# Patient Record
Sex: Female | Born: 1975
Health system: Southern US, Community
[De-identification: ages and names within clinical notes are randomized; demographics above are authoritative.]

## PROBLEM LIST (undated history)

## (undated) DIAGNOSIS — Z8489 Family history of other specified conditions: Secondary | ICD-10-CM

## (undated) DIAGNOSIS — E039 Hypothyroidism, unspecified: Secondary | ICD-10-CM

## (undated) DIAGNOSIS — M8580 Other specified disorders of bone density and structure, unspecified site: Secondary | ICD-10-CM

## (undated) DIAGNOSIS — K589 Irritable bowel syndrome without diarrhea: Secondary | ICD-10-CM

## (undated) HISTORY — DX: Other specified disorders of bone density and structure, unspecified site: M85.80

## (undated) HISTORY — DX: Hypothyroidism, unspecified: E03.9

---

## 2001-08-26 HISTORY — PX: TONSILLECTOMY: SUR1361

## 2005-01-23 ENCOUNTER — Inpatient Hospital Stay (HOSPITAL_COMMUNITY): Admission: AD | Admit: 2005-01-23 | Discharge: 2005-01-23 | Payer: Self-pay | Admitting: Obstetrics and Gynecology

## 2005-01-24 ENCOUNTER — Inpatient Hospital Stay (HOSPITAL_COMMUNITY): Admission: AD | Admit: 2005-01-24 | Discharge: 2005-01-24 | Payer: Self-pay | Admitting: Obstetrics and Gynecology

## 2005-03-02 ENCOUNTER — Inpatient Hospital Stay (HOSPITAL_COMMUNITY): Admission: AD | Admit: 2005-03-02 | Discharge: 2005-03-03 | Payer: Self-pay | Admitting: Obstetrics and Gynecology

## 2007-02-18 ENCOUNTER — Ambulatory Visit: Payer: Self-pay | Admitting: Oncology

## 2007-07-29 ENCOUNTER — Inpatient Hospital Stay (HOSPITAL_COMMUNITY): Admission: AD | Admit: 2007-07-29 | Discharge: 2007-07-29 | Payer: Self-pay | Admitting: Obstetrics and Gynecology

## 2007-07-30 ENCOUNTER — Inpatient Hospital Stay (HOSPITAL_COMMUNITY): Admission: AD | Admit: 2007-07-30 | Discharge: 2007-07-30 | Payer: Self-pay | Admitting: *Deleted

## 2007-08-17 ENCOUNTER — Observation Stay (HOSPITAL_COMMUNITY): Admission: RE | Admit: 2007-08-17 | Discharge: 2007-08-17 | Payer: Self-pay | Admitting: Obstetrics and Gynecology

## 2007-08-31 ENCOUNTER — Inpatient Hospital Stay (HOSPITAL_COMMUNITY): Admission: AD | Admit: 2007-08-31 | Discharge: 2007-09-03 | Payer: Self-pay | Admitting: Obstetrics and Gynecology

## 2010-09-16 ENCOUNTER — Encounter: Payer: Self-pay | Admitting: Family Medicine

## 2011-01-08 NOTE — Op Note (Signed)
Brittney Cannon, Brittney Cannon                 ACCOUNT NO.:  [id number]   MEDICAL RECORD NO.:  [id number]          PATIENT TYPE:  OBV   LOCATION:  9174                          [facility]  [facility]   PHYSICIAN:  Lenoard Aden, M.D.DATE OF BIRTH:  December 13, 1975   DATE OF PROCEDURE:  DATE OF DISCHARGE:  08/17/2007                               OPERATIVE REPORT   PREOPERATIVE DIAGNOSIS:  Breech malpresentation for external cephalic  version.   POSTOPERATIVE DIAGNOSIS:  Breech malpresentation for external cephalic  version.   PROCEDURE:  Attempted external cephalic version.   SURGEON:  Lenoard Aden, M.D.   ASSISTANT:  Marlinda Mike, C.N.M.   ANESTHESIA:  IV sedation.   DESCRIPTION OF PROCEDURE:  After being appraised of the risks and  benefits of the external cephalic version __________  a small risk of  fetal bradycardia with a need for C-section, success rates of  approximately 60%, the  patient is administered IV Stadol for relaxation  and terbutaline 0.25 mg subcu.  External cephalic version is then  attempted after reactive NST with a forward roll, backward roll and then  reattempt at forward roll under ultrasound guidance.  Fetal heart tones  dropped into the 90s briefly but then resumed in the 120 to 140 beat per  minute range.  Fetus was moved approximately into a oblique position but  could not be moved into vertex presentation during the external cephalic  version procedure.  No vaginal bleeding was noted.  Fetal heart tones  are reactive post procedure.  The patient tolerated the procedure well  and is recovering in good condition.      Lenoard Aden, M.D.  Electronically Signed     RJT/MEDQ  D:  08/17/2007  T:  08/17/2007  Job:  [id number]   cc:   Lenoard Aden, M.D.  Fax: [phone]

## 2011-01-08 NOTE — Discharge Summary (Signed)
Brittney Cannon, HERON                 ACCOUNT NO.:  [id number]   MEDICAL RECORD NO.:  [id number]          PATIENT TYPE:  OBV   LOCATION:  9174                          [facility]   PHYSICIAN:  Lenoard Aden, M.D.DATE OF BIRTH:  February 25, 1976   DATE OF ADMISSION:  08/17/2007  DATE OF DISCHARGE:  08/17/2007                               DISCHARGE SUMMARY   CHIEF COMPLAINT:  Persistent breach presentation at 36 weeks for  external cephalic version.   HISTORY OF PRESENT ILLNESS:  She is a 35 year old white female G3, P2,  history of spontaneous vaginal delivery x2 who presents at 36 weeks with  persistent breech presentation for external cephalic version.   ALLERGIES:  LATEX, PENICILLIN, HYDROCODONE.   SOCIAL HISTORY:  She is a nonsmoker, nondrinker.  She denies domestic or  physical violence.   PAST MEDICAL HISTORY:  1. History of irritable bowel syndrome.  2. Migraine headaches.  3. Depression.  4. Anemia.  5. No other medical problems.   FAMILY HISTORY:  Heart disease, mitral valve disease, hypertension,  ovarian cancer, CVA, spina bifida.   PAST OBSTETRICS HISTORY:  She has a pregnancy remarkable for a 6 pound,  12 ounce child born forceps, fourth degree laceration; history of 5  pound 8 ounce uncomplicated delivery.  She has new onset  antiphospholipid antibody syndrome for which she is untreated.  At this  time, she declined Lovenox therapy.  She has protein S deficiency and  anticardiolipin antibodies, and she was previously taking baby aspirin  daily.   PHYSICAL EXAMINATION:  GENERAL:  Well-developed, well-nourished, white  female in no acute distress.  HEENT:  Normal.  LUNGS:  Clear.  HEART:  Regular rhythm.  ABDOMEN:  Soft, gravid, nontender.  Estimated fetal weight  5-1/2  pounds, cervix is 2-3 cm, 60% vertex, -1.  EXTREMITIES:  No edema.  NEUROLOGICAL:  Nonfocal.  SKIN:  Intact.   IMPRESSION:  A 36-week intrauterine pregnancy for external cephalic  version with known breech malpresentation.   PLAN:  Proceed with external cephalic version.  Risks and benefits  discussed.  Small risk of fetal bradycardia and necessitating emergent C-  section in approximately 1-3% noted.  Success rates of 60% noted.  The  patient acknowledges and wishes to proceed.      Lenoard Aden, M.D.  Electronically Signed     RJT/MEDQ  D:  08/17/2007  T:  08/17/2007  Job:  [id number]

## 2011-01-11 NOTE — H&P (Signed)
Brittney Cannon, Brittney Cannon                 ACCOUNT NO.:  [id number]   MEDICAL RECORD NO.:  [id number]          PATIENT TYPE:  INP   LOCATION:  9108                          [facility]  [facility]   PHYSICIAN:  Lenoard Aden, M.D.DATE OF BIRTH:  1976/02/03   DATE OF ADMISSION:  03/02/2005  DATE OF DISCHARGE:                                HISTORY & PHYSICAL   CHIEF COMPLAINT:  Labor.   HISTORY OF PRESENT ILLNESS:  The patient is a 35 year old white female, G2,  P67, EDD of March 26, 2005, at 36+ weeks' gestation in active labor.   She has allergies to LATEX TAPE, PENICILLIN and HYDROCODONE.   Medications are prenatal vitamins.   She has a history of IVF-induced conception, history of depression, history  of a traumatic vaginal delivery with a fourth degree laceration and  questionable symphysis pubis separation, history of nonspecific heart  murmur.   Family history of hypertension, mitral valve prolapse, heart disease and  family history of spina bifida.   PRENATAL LABORATORY DATA:  Blood type O positive.  Rubella immune.  Hepatitis negative.  GBS negative.   PHYSICAL EXAMINATION:  GENERAL:  She is a well-developed, well-nourished  white female in no acute distress.  HEENT:  Normal.  CHEST:  Lungs clear.  CARDIAC:  Regular rate and rhythm.  ABDOMEN:  Soft, gravid, nontender.  Estimated fetal weight 6 pounds.  PELVIC:  Cervix is 10 cm, 100%, vertex +2.  EXTREMITIES:  Without cords.  NEUROLOGIC:  Nonfocal.   IMPRESSION:  1.  Thirty-six week intrauterine pregnancy in active labor.  2.  History of fourth degree laceration with traumatic delivery, patient      declined cesarean section.   PLAN:  Anticipate cautious attempts at vaginal delivery.  Risks and benefits  discussed.     RJT/MEDQ  D:  03/02/2005  T:  03/02/2005  Job:  [id number]

## 2011-01-11 NOTE — Op Note (Signed)
NAMETRENIA, TENNYSON                 ACCOUNT NO.:  [id number]   MEDICAL RECORD NO.:  [id number]          PATIENT TYPE:  INP   LOCATION:  9110                          [facility]   PHYSICIAN:  Lenoard Aden, M.D.DATE OF BIRTH:  [birth year]   DATE OF PROCEDURE:  08/30/2006  DATE OF DISCHARGE:                               OPERATIVE REPORT   PREOPERATIVE DIAGNOSIS:  A 37-week intrauterine pregnancy, breech in  active labor.   POSTOPERATIVE DIAGNOSIS:  A 37-week intrauterine pregnancy, breech in  active labor.   PROCEDURE:  Primary low segment transverse section.   SURGEON:  Lenoard Aden, M.D.   ANESTHESIA:  Spinal by Germaine Pomfret, M.D.   ESTIMATED BLOOD LOSS:  500 mL.   COMPLICATIONS:  None.   DRAINS:  Foley.   COUNTS:  Correct.   DISPOSITION:  To recovery in good condition.   FINDINGS:  A full-term living female.  Frank breech, Apgars 08/09.  Pediatricians in attendance.  Posterior placenta.  Normal uterus, normal  tubes, normal ovaries.  Two-layer closure.   OPERATIVE NOTE:  After being apprised of the risks of anesthesia,  infection, bleeding, injury to intra-abdominal organs, need for repair,  delayed risk of any complications include bowel and bladder injury.  The  patient was brought to the operating where she was administered spinal  anesthetic without complications, prepped, and draped in the usual  fashion.  Foley catheter placed.  After achieving adequate anesthesia  with dilute Marcaine solution, placed a Pfannenstiel skin incision made  with scalpel, carried down to fascia which was nicked in the midline and  transfixed using Mayo scissors.  Rectus muscles dissected sharply in the  midline.  Peritoneum entered sharply.  Bladder blade placed.  Visceral  peritoneum scored sharply off the lower uterine segment where a  hysterotomy incision made.  Atraumatic delivery of full-term living  female.  Handed to pediatrician in attendance.  Apgars 8 and 9.   Cord  blood collected.  Placenta delivered manually intact, 3-vessel cord  noted.  Uterus curetted with a dry lap pack,  and closed in two running imbricating layers of a #0 Monocryl suture.  Good hemostasis was noted.  Bladder flap inspected, found be hemostatic.  Irrigation accomplished.  At this time the fascia was closed using #0  Monocryl in a running fashion.  Skin closed using staples.  The patient  tolerated this procedure well, to recovery in good condition.      Lenoard Aden, M.D.  Electronically Signed     RJT/MEDQ  D:  08/31/2007  T:  08/31/2007  Job:  [id number]

## 2011-01-11 NOTE — Discharge Summary (Signed)
Brittney Cannon, Brittney Cannon                 ACCOUNT NO.:  [id number]   MEDICAL RECORD NO.:  [id number]          PATIENT TYPE:  INP   LOCATION:  9110                          [facility]   PHYSICIAN:  Lenoard Aden, M.D.DATE OF BIRTH:  1975-09-16   DATE OF ADMISSION:  08/31/2007  DATE OF DISCHARGE:  09/03/2007                               DISCHARGE SUMMARY   HISTORY OF PRESENT ILLNESS:  The patient underwent uncomplicated primary  Cesarean section on August 31, 2007. Postoperative course uncomplicated.   DISPOSITION:  Discharged to home on day 3. Discharge teaching done.   DISCHARGE MEDICATIONS:  1. Prenatal vitamin.  2. Iron.  3. Coumadin.  4. Dilaudid.   FOLLOWUP:  1. The patient to followup in the office in 4 to 6 weeks.  2. Followup with hematology as noted.      Lenoard Aden, M.D.  Electronically Signed     RJT/MEDQ  D:  09/22/2007  T:  09/22/2007  Job:  [id number]

## 2011-05-15 LAB — CBC
MCHC: 34.7
Platelets: 187
Platelets: 256
RDW: 12.7
RDW: 12.7
WBC: 9.1

## 2011-05-15 LAB — PROTIME-INR
INR: 0.9
INR: 1
Prothrombin Time: 12.9

## 2015-12-05 ENCOUNTER — Encounter: Payer: Self-pay | Admitting: Internal Medicine

## 2015-12-29 ENCOUNTER — Encounter: Payer: Self-pay | Admitting: Internal Medicine

## 2015-12-29 ENCOUNTER — Ambulatory Visit (INDEPENDENT_AMBULATORY_CARE_PROVIDER_SITE_OTHER): Payer: 59 | Admitting: Internal Medicine

## 2015-12-29 VITALS — BP 128/78 | HR 83 | Temp 98.3°F | Resp 18 | Ht 65.5 in | Wt 135.0 lb

## 2015-12-29 DIAGNOSIS — M8580 Other specified disorders of bone density and structure, unspecified site: Secondary | ICD-10-CM | POA: Diagnosis not present

## 2015-12-29 DIAGNOSIS — E039 Hypothyroidism, unspecified: Secondary | ICD-10-CM | POA: Diagnosis not present

## 2015-12-29 DIAGNOSIS — E063 Autoimmune thyroiditis: Secondary | ICD-10-CM | POA: Diagnosis not present

## 2015-12-29 DIAGNOSIS — Z0000 Encounter for general adult medical examination without abnormal findings: Secondary | ICD-10-CM

## 2015-12-29 DIAGNOSIS — G4700 Insomnia, unspecified: Secondary | ICD-10-CM

## 2015-12-29 LAB — POCT URINALYSIS DIPSTICK
BILIRUBIN UA: NEGATIVE
Blood, UA: NEGATIVE
Glucose, UA: NEGATIVE
KETONES UA: NEGATIVE
LEUKOCYTES UA: NEGATIVE
Nitrite, UA: NEGATIVE
Protein, UA: NEGATIVE
Spec Grav, UA: 1.015
Urobilinogen, UA: 0.2
pH, UA: 7

## 2015-12-29 NOTE — Progress Notes (Signed)
Subjective:    Patient ID: Brittney Cannon, female    DOB: 07-15-1976, 40 y.o.   MRN: [id number]  HPI 40 year old White Female presents to the office for the first time today. Onset of hypothyroidism 6-7 years ago. Cannot take Synthroid- says had severe allergic reaction. Takes Naturthroid. Sees Wellness physician. Has a notebook with numerous lab tests from wellness physician.  Intolerant of many medications. Cannot take penicillin causes breathing issues and a rash. Erythromycin causes vomiting and a rash. Codeine causes vomiting and a rash. Latex causes a rash. Doxycycline causes nausea and vomiting. Biaxin causes adverse reaction which patient cannot remember. Zithromax causes abdominal cramping.  Therefore, patient says she can take Levaquin and Keflex.  Dr. Billy Coast is GYN physician.  History of right second toe and metatarsal fracture June 2016. Says she had a similar event 4 years previously.  Tonsillectomy and sinus surgery 2003. C-section 2009.  3 pregnancies  Social history: Married. Does not smoke. Social alcohol consumption daily. Employed by [employer] on [place]. Has degree in MIS from [employer]. Works with [employer]. 3 sons ages 2110 and 8.  Family history: Father with history of hypertension. Mother with history of osteoporosis, hip fracture, arthritis and kidney stones. 2 brothers one of whom is overweight. One sister in good health.  History of anti-Cardiolipin antibody  Last mammogram December 2016 at [facility]  Had bone density study at [facility] August 2014 with right femoral neck T score being -1.9, left femoral neck -2.1, LS spine -0.3 - diagnosed with osteopenia. Had bone density study December 2016. Total left femur T score -2.3 and. LS spine -0.7  At one point she took Forteo injections but that was some years ago. Currently taking calcium and vitamin D as well as multivitamin.    Review of Systems  HENT:       Difficulty breathing through nose    Endocrine:       Hair loss  Genitourinary:       Menarche at age 45. History of dysmenorrhea.  Musculoskeletal:       Joint pain  Neurological:       Complain of difficulty maintaining balance and some dizziness but not true vertigo, vague weakness in hands and feet  Psychiatric/Behavioral:       Complained of insomnia, difficulty smelling things       Objective:   Physical Exam  Constitutional: She is oriented to person, place, and time. She appears well-developed and well-nourished. No distress.  HENT:  Head: Normocephalic and atraumatic.  Right Ear: External ear normal.  Left Ear: External ear normal.  Mouth/Throat: Oropharynx is clear and moist. No oropharyngeal exudate.  Eyes: Conjunctivae and EOM are normal. Pupils are equal, round, and reactive to light.  Neck: Neck supple. No JVD present. No thyromegaly present.  Cardiovascular: Normal heart sounds.   No murmur heard. Pulmonary/Chest: Effort normal and breath sounds normal. She has no wheezes. She has no rales.  Breasts normal female without masses  Abdominal: Soft. Bowel sounds are normal. She exhibits no distension and no mass. There is no tenderness. There is no rebound and no guarding.  Genitourinary:  Deferred to GYN  Musculoskeletal: She exhibits no edema.  Lymphadenopathy:    She has no cervical adenopathy.  Neurological: She is alert and oriented to person, place, and time. She has normal reflexes. No cranial nerve deficit. Coordination normal.  Skin: Skin is warm and dry. No rash noted. She is not diaphoretic.  Psychiatric: She has a  normal mood and affect. Her behavior is normal. Judgment and thought content normal.  Vitals reviewed.         Assessment & Plan:  History of hypothyroidism  History of osteopenia-last bone density study 2016  History of anticardiolipin antibody  Plan: Patient says she was to be proactive with regard to osteopenia. Worried about her condition since mother has  osteoporosis and history of hip fracture. Have suggested endocrinology consultation. She will consider it. Patient was in a hurry today. Return in one year or as needed. Wellness physician keep a check on her thyroid functions. Recent labs reviewed and appear to be within normal limits.  [facility] case she takes Vyvanse, Flexeril, Xanax 1 mg at bedtime for insomnia, when necessary Phenergan, Cytomel in addition to other medications mentioned. Previously [facility].

## 2016-01-01 ENCOUNTER — Encounter: Payer: Self-pay | Admitting: Internal Medicine

## 2016-01-21 ENCOUNTER — Encounter: Payer: Self-pay | Admitting: Internal Medicine

## 2016-01-21 DIAGNOSIS — G4700 Insomnia, unspecified: Secondary | ICD-10-CM | POA: Insufficient documentation

## 2016-01-21 NOTE — Patient Instructions (Signed)
Consider endocrinology consultation regarding management of osteopenia.

## 2016-04-30 ENCOUNTER — Encounter: Payer: Self-pay | Admitting: Internal Medicine

## 2016-04-30 ENCOUNTER — Ambulatory Visit (INDEPENDENT_AMBULATORY_CARE_PROVIDER_SITE_OTHER): Payer: 59 | Admitting: Internal Medicine

## 2016-04-30 VITALS — BP 122/74 | HR 90 | Temp 98.6°F | Ht 65.5 in | Wt 139.5 lb

## 2016-04-30 DIAGNOSIS — J0100 Acute maxillary sinusitis, unspecified: Secondary | ICD-10-CM

## 2016-04-30 DIAGNOSIS — H6503 Acute serous otitis media, bilateral: Secondary | ICD-10-CM | POA: Diagnosis not present

## 2016-04-30 MED ORDER — LEVOFLOXACIN 250 MG PO TABS: 250.0000 mg | ORAL_TABLET | Freq: Every day | ORAL | 0 refills | Status: DC

## 2016-05-02 ENCOUNTER — Encounter: Payer: Self-pay | Admitting: Endocrinology

## 2016-05-02 ENCOUNTER — Ambulatory Visit (INDEPENDENT_AMBULATORY_CARE_PROVIDER_SITE_OTHER): Payer: Managed Care, Other (non HMO) | Admitting: Endocrinology

## 2016-05-02 VITALS — BP 122/60 | HR 112 | Ht 65.5 in | Wt 139.0 lb

## 2016-05-02 DIAGNOSIS — M8580 Other specified disorders of bone density and structure, unspecified site: Secondary | ICD-10-CM | POA: Diagnosis not present

## 2016-05-02 NOTE — Progress Notes (Addendum)
Subjective:    Patient ID: Brittney Cannon, female    DOB: 04/25/1976, 40 y.o.   MRN: [id number]  HPI Pt is referred by Dr Lenord Fellers, for osteoporosis.  She was noted to have osteoporosis in 2009.  She took forteo x 6 months, in 2013.  She stopped due to nausea.  She then took growth hormone x 6 months, also in 2013.  She has had 3 bony fractures, all of the feet (2010-2016, each with minor injuries).  She has no history of any of the following: early menopause, cancer, renal dz, prolonged bedrest, smoking, liver dz, primary hyperparathyroidism.  She does not take heparin or anticonvulsants.  She took intermittent steroids for allergic rhinitis, but none in the past few years.  She drinks 1 drink qod.  She intermittently takes compounded vitamin-D and testsoterone  She took lovenox intermittently from 2006-2009).  She has mild pain throughout the body, and assoc headache.  Past Medical History:  Diagnosis Date  . Hypothyroidism   . Osteopenia     Past Surgical History:  Procedure Laterality Date  . CESAREAN SECTION  2009  . TONSILLECTOMY  2003    Social History   Social History  . Marital status: Married    Spouse name: N/A  . Number of children: N/A  . Years of education: N/A   Occupational History  . Not on file.   Social History Main Topics  . Smoking status: Never Smoker  . Smokeless tobacco: Never Used  . Alcohol use Not on file  . Drug use: Unknown  . Sexual activity: Not on file   Other Topics Concern  . Not on file   Social History Narrative  . No narrative on file    Current Outpatient Prescriptions on File Prior to Visit  Medication Sig Dispense Refill  . ALPRAZolam (XANAX) 1 MG tablet Take 1 mg by mouth.    . cyclobenzaprine (FLEXERIL) 5 MG tablet     . fluticasone (FLONASE) 50 MCG/ACT nasal spray     . levofloxacin (LEVAQUIN) 250 MG tablet Take 1 tablet (250 mg total) by mouth daily. 10 tablet 0  . lisdexamfetamine (VYVANSE) 40 MG capsule Take 40 mg by mouth  every morning.    Marland Kitchen NATURE-THROID 162.5 MG TABS TK 1 T PO QD  5  . Promethazine HCl (PHENERGAN PO) Take by mouth.     No current facility-administered medications on file prior to visit.     Allergies  Allergen Reactions  . Doxycycline Nausea And Vomiting    Patient reported  . Azithromycin   . Codeine Nausea And Vomiting  . Erythromycin Nausea And Vomiting  . Lactose Intolerance (Gi)   . Latex Dermatitis  . Penicillins Hives  . Tramadol Itching    Family History  Problem Relation Age of Onset  . Osteoporosis Mother   . Osteoporosis Sister   . Osteoporosis Maternal Grandmother    BP 122/60   Pulse (!) 112   Ht 5' 5.5" (1.664 m)   Wt 139 lb (63 kg)   SpO2 98%   BMI 22.78 kg/m   Review of Systems denies weight loss, hematuria, heartburn, cold intolerance, edema, skin rash, falls, cramps, memory loss, easy bruising, and rhinorrhea. She has insomnia and low-back pain.      Objective:   Physical Exam VS: see vs page GEN: no distress HEAD: head: no deformity eyes: no periorbital swelling, no proptosis external nose and ears are normal mouth: no lesion seen NECK: supple, thyroid is  not enlarged CHEST WALL: no deformity.  No kyphosis LUNGS: clear to auscultation CV: reg rate and rhythm, no murmur.   ABD: abdomen is soft, nontender.  no hepatosplenomegaly.  not distended.  no hernia MUSCULOSKELETAL: muscle bulk and strength are grossly normal.  no obvious joint swelling.  gait is normal and steady EXTEMITIES: no deformity.  no edema PULSES: no carotid bruit NEURO:  cn 2-12 grossly intact.   readily moves all 4's.  sensation is intact to touch on all 4's.  SKIN:  Normal texture and temperature.  No rash or suspicious lesion is visible.   NODES:  None palpable at the neck.  PSYCH: alert, well-oriented.  Does not appear anxious nor depressed.   outside test results are reviewed: Testosterone=209. Free T4=0.85 Ca++=9.5 Tot prot=normal    Assessment & Plan:    Osteoporosis: new to me: given her young age, I advised fosamax, reclast, or prolia.  She declines. However, I asked her to reconsider.  Myalgias and/or arthralgias, not related to the above

## 2016-05-02 NOTE — Patient Instructions (Addendum)
Please fax us the bone-density test results.  Please also have a TSH and PTH done, and send us the results.   Please consider taking actonel (once a week pill), or reclast (once a year infusion), and let us know.

## 2016-05-04 ENCOUNTER — Encounter: Payer: Self-pay | Admitting: Endocrinology

## 2016-05-22 NOTE — Progress Notes (Signed)
   Subjective:    Patient ID: Brittney Cannon, female    DOB: [birth year], 40 y.o.   MRN: [id number]  HPI Three-day history of right-sided headache and respiratory congestion. Has malaise and fatigue. No documented fever. Feels that she might have a sinus infection.    Review of Systems as above     Objective:   Physical Exam Skin warm and dry. Nodes none. TMs full bilaterally but not dull. Neck is supple. No adenopathy. Chest clear to auscultation without rales or wheezing.       Assessment & Plan:  Acute maxillary sinusitis  Bilateral serous otitis media  Plan: Levaquin 250 mg daily for 10 days. Flonase nasal spray 2 sprays in each nostril daily. Call if not better in 7-10 days or sooner if worse.

## 2016-05-22 NOTE — Patient Instructions (Signed)
Levaquin 250 mg daily for 10 days. Flonase nasal spray as directed. Call if not better in 7-10 days or sooner if worse.

## 2016-10-11 ENCOUNTER — Other Ambulatory Visit: Payer: Self-pay | Admitting: Internal Medicine

## 2016-10-11 MED ORDER — ALPRAZOLAM 0.5 MG PO TABS: 0.5000 mg | ORAL_TABLET | Freq: Two times a day (BID) | ORAL | 0 refills | Status: DC | PRN

## 2016-11-19 ENCOUNTER — Telehealth: Payer: Self-pay | Admitting: Internal Medicine

## 2016-11-19 MED ORDER — SCOPOLAMINE 1 MG/3DAYS TD PT72: 1.0000 | MEDICATED_PATCH | TRANSDERMAL | 0 refills | Status: DC

## 2016-11-19 NOTE — Telephone Encounter (Signed)
Please call in transderm scop transdermal patches. Apply one q 3 days.

## 2016-11-19 NOTE — Telephone Encounter (Signed)
E-SCRIBED to [facility] [phone] - [place], [place] - [phone] AT [facility] [address]. Patient was notified.
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
## 2016-11-22 MED FILL — TRANSDERM-SCOP 1.5 MG/3 DAY: 1 | 30 days supply | Qty: 10 | Fill #0

## 2017-01-10 ENCOUNTER — Ambulatory Visit: Payer: 59 | Admitting: Internal Medicine

## 2017-04-14 ENCOUNTER — Ambulatory Visit (INDEPENDENT_AMBULATORY_CARE_PROVIDER_SITE_OTHER): Payer: 59 | Admitting: Internal Medicine

## 2017-04-14 ENCOUNTER — Encounter: Payer: Self-pay | Admitting: Internal Medicine

## 2017-04-14 VITALS — BP 112/70 | HR 95 | Temp 98.3°F | Ht 65.5 in | Wt 132.0 lb

## 2017-04-14 DIAGNOSIS — M8580 Other specified disorders of bone density and structure, unspecified site: Secondary | ICD-10-CM

## 2017-04-14 DIAGNOSIS — F4320 Adjustment disorder, unspecified: Secondary | ICD-10-CM

## 2017-04-14 DIAGNOSIS — F4321 Adjustment disorder with depressed mood: Secondary | ICD-10-CM

## 2017-04-14 DIAGNOSIS — F5102 Adjustment insomnia: Secondary | ICD-10-CM

## 2017-04-14 MED ORDER — ALPRAZOLAM 0.5 MG PO TABS: ORAL_TABLET | ORAL | 0 refills | Status: DC

## 2017-04-14 NOTE — Patient Instructions (Addendum)
Try Xanax 0.5 mg every morning and 1 mg at bedtime. Follow-up in 4 weeks. Consider individual counseling. Bone density study ordered.

## 2017-04-14 NOTE — Progress Notes (Signed)
   Subjective:    Patient ID: Brittney Cannon, female    DOB: 1976-03-24, 41 y.o.   MRN: [id number]  HPI 41 year old Female in today to discuss issues surrounding grief and loss of her husband to liver cancer recently. She is not sleeping well. His been taking Xanax but only gets a couple hours of sleep.  Has been very fatigued and is aching all over.  Trouble thinking clearly.  She has 3 sons ages 8, 31 and 35  Osteoporosis runs in her mother. Patient had bone density study at [facility] in 2016 showing osteopenia. She is very concerned about this. She saw Dr. Romero Cannon for an opinion regarding therapy in September 2017. He discussed Fosamax re-classed or Prolene for her since she is young but she really didn't want to consider those options. She has mentioned human growth hormone that they'll agree with that therapy. At that time she was also complaining of myalgias and arthralgias but he did not feel that these were related to osteopenia nor do I.  I think it's possible she may have fibromyalgia syndrome.  Have recommended Brittney Cannon for counseling.      Review of Systems see above . Spent 25 minutes speaking with her about these issues. I have ordered another bone density study to be done in the near future since the last one was 2016.     Objective:   Physical Exam  Not examined see above      Assessment & Plan:  Grief reaction  Fatigue  Myalgias  Osteopenia-She may want to see another endocrinologist  Insomnia due to grief  Plan: Patient does not want to be on antidepressant therapy. She is to consider counseling with individual counselor. Bone density study ordered at [facility] to compare with 2016 study. Try Xanax 0.5 mg every morning and 1 mg at bedtime. Follow-up in 4 weeks.

## 2017-05-13 ENCOUNTER — Encounter: Payer: Self-pay | Admitting: Internal Medicine

## 2017-05-13 ENCOUNTER — Ambulatory Visit (INDEPENDENT_AMBULATORY_CARE_PROVIDER_SITE_OTHER): Payer: 59 | Admitting: Internal Medicine

## 2017-05-13 VITALS — BP 120/60 | HR 92 | Temp 99.6°F | Wt 138.0 lb

## 2017-05-13 DIAGNOSIS — F4321 Adjustment disorder with depressed mood: Secondary | ICD-10-CM

## 2017-05-13 DIAGNOSIS — F4320 Adjustment disorder, unspecified: Secondary | ICD-10-CM | POA: Diagnosis not present

## 2017-05-13 DIAGNOSIS — F5102 Adjustment insomnia: Secondary | ICD-10-CM

## 2017-05-13 DIAGNOSIS — M8580 Other specified disorders of bone density and structure, unspecified site: Secondary | ICD-10-CM | POA: Diagnosis not present
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
## 2017-05-13 NOTE — Patient Instructions (Addendum)
Lab work to be done at [facility] for fatigue. Continue Xanax as needed for anxiety and insomnia. Try to get endocrinology consultation for osteopenia.

## 2017-05-14 ENCOUNTER — Encounter: Payer: Self-pay | Admitting: Internal Medicine

## 2017-05-14 ENCOUNTER — Telehealth: Payer: Self-pay

## 2017-05-14 NOTE — Telephone Encounter (Signed)
Dr. Leslie Dales is declining to see her as a patient. Dr. Lenord Fellers is aware. Waiting on her request before moving forward

## 2017-05-21 ENCOUNTER — Encounter: Payer: Self-pay | Admitting: Internal Medicine

## 2017-08-18 ENCOUNTER — Encounter: Payer: Self-pay | Admitting: Internal Medicine

## 2017-08-18 ENCOUNTER — Telehealth: Payer: Self-pay | Admitting: Internal Medicine

## 2017-08-18 MED ORDER — LEVOFLOXACIN 500 MG PO TABS: 500.0000 mg | ORAL_TABLET | Freq: Every day | ORAL | 0 refills | Status: DC

## 2017-08-18 MED ORDER — HYDROCODONE-HOMATROPINE 5-1.5 MG/5ML PO SYRP: 5.0000 mL | ORAL_SOLUTION | Freq: Three times a day (TID) | ORAL | 0 refills | Status: DC | PRN

## 2017-08-18 NOTE — Telephone Encounter (Signed)
Pt called. [place] tomorrow. Has had URI for one week. Not sleeping. Does not want OV today. No fever or chills. Says Levaquin works for her. Will pick up Rx for Hycodan Call in Levaquin to pharmacy.

## 2017-09-01 ENCOUNTER — Encounter: Payer: Self-pay | Admitting: Internal Medicine

## 2017-09-01 ENCOUNTER — Ambulatory Visit
Admission: RE | Admit: 2017-09-01 | Discharge: 2017-09-01 | Disposition: A | Discharge status: Home / Self Care | Payer: 59 | Source: Ambulatory Visit | Attending: Internal Medicine | Admitting: Internal Medicine

## 2017-09-01 ENCOUNTER — Ambulatory Visit (INDEPENDENT_AMBULATORY_CARE_PROVIDER_SITE_OTHER): Payer: 59 | Admitting: Internal Medicine

## 2017-09-01 VITALS — BP 112/70 | HR 88 | Temp 99.1°F | Ht 65.5 in | Wt 142.0 lb

## 2017-09-01 DIAGNOSIS — R0602 Shortness of breath: Secondary | ICD-10-CM

## 2017-09-01 DIAGNOSIS — R0600 Dyspnea, unspecified: Secondary | ICD-10-CM | POA: Diagnosis not present

## 2017-09-01 DIAGNOSIS — R5383 Other fatigue: Secondary | ICD-10-CM | POA: Diagnosis not present

## 2017-09-01 DIAGNOSIS — R911 Solitary pulmonary nodule: Secondary | ICD-10-CM

## 2017-09-01 NOTE — Patient Instructions (Signed)
To have CT scan of chest.  Labs drawn and pending including thyroid functions, CBC and sed rate.  Further instructions to follow-up once CT scan results are obtained .

## 2017-09-01 NOTE — Progress Notes (Signed)
   Subjective:    Patient ID: Brittney Cannon, female    DOB: 09/16/1975, 42 y.o.   MRN: [id number]  HPI her husband died late last year complications of liver cancer.  She took [place] 2 [place] for Christmas break.  Was gone about 8 days.  Left on December 25 and just got back over the weekend.  Had respiratory infection prior to leaving and was prescribed.  Levaquin was prescribed for her by phone on December 24 however it made her nauseated.  Noticed she was short of breath while in [place].  It first she attributed to the altitude and not being used to it but it persisted.  Says she used to live in [place] and tolerated it fine at that point.  Has not really been exercising recently.  Says she does not feel like it.  Says she cannot get her house in order.  Says she used to take Vyvanse for attention deficit but has not been taking it since her husband passed away.  Says 2 sons are doing well adjusting.  Denies chest pain.  We walk down the hall with pulse oximetry and it did not change.  Remain the same at 97%.  She went to have chest x-ray and Dr. Chestine Sporelark noticed there is possible right upper lung nodule and recommended CT scan which will be ordered.  She is going to [facility] to have some lab work drawn including CBC sed rate and TSH.  She goes to [facility] in [place].  Has been told that Epstein-Barr virus titer has increased recently.  She attributes this to stress.    Review of Systems See above-EKG is within normal limits    Objective:   Physical Exam Skin warm and dry.  Neck is supple without JVD thyromegaly or carotid bruits.  Chest clear to auscultation without rales or wheezing.  Cardiac exam regular rate and rhythm.  EKG is within normal limits.  Extremities without edema.       Assessment & Plan:  Unexplained dyspnea-to have CT scan in the near future  EKG is within normal limits.  She is to have fasting lab work at [facility]  tomorrow.  CT scan of the chest pending.  She may also need to have 2D echocardiogram.

## 2017-09-02 ENCOUNTER — Ambulatory Visit
Admission: RE | Admit: 2017-09-02 | Discharge: 2017-09-02 | Disposition: A | Discharge status: Home / Self Care | Payer: 59 | Source: Ambulatory Visit | Attending: Internal Medicine | Admitting: Internal Medicine

## 2017-09-02 DIAGNOSIS — R0602 Shortness of breath: Secondary | ICD-10-CM

## 2017-09-02 DIAGNOSIS — R0600 Dyspnea, unspecified: Secondary | ICD-10-CM

## 2017-09-02 MED ORDER — IOPAMIDOL (ISOVUE-300) INJECTION 61%
75.0000 mL | Freq: Once | INTRAVENOUS | Status: AC | PRN
  Administered 2017-09-02: 75 mL via INTRAVENOUS

## 2017-09-04 ENCOUNTER — Other Ambulatory Visit: Payer: 59

## 2017-09-05 ENCOUNTER — Telehealth: Payer: Self-pay

## 2017-10-10 NOTE — Telephone Encounter (Signed)
Error

## 2017-11-18 ENCOUNTER — Telehealth: Payer: Self-pay

## 2017-11-18 NOTE — Telephone Encounter (Signed)
I don't know what she means about stopping periods. Please call her. May need to see GYN.

## 2017-11-18 NOTE — Telephone Encounter (Signed)
Spoke with patient.  She is going on a big vacation and she is due to start her menstrual cycle in a few days.  She would like to be prescribed Norethisterone so that her period can be delayed until she returns from vacation.  She has done this years ago.  It was either .30 or .35 mg.  It has been so many years ago, she's not sure of the mg dosage, but it's supposed to be started 2 or 3 days prior to when your period starts and then when you stop taking it, you start your cycle and have it like you would normally have it.  She states she just didn't want to have to deal with her cycle on her vacation.    Pharmacy:  [facility] [address]  Phone:  [phone]

## 2017-11-18 NOTE — Telephone Encounter (Signed)
Patient called states she was prescribed norethindrone in the past to stop her periods. She would like a refill but she does not know the dose she said it's norethindrone.  [facility] to [facility] and they said they don't have anything on file.  I called patient to see where she got this prescription last and her voice mail was full.  She [facility] [address]

## 2017-11-18 NOTE — Telephone Encounter (Signed)
Called patient at 4 PM.  She is leaving tomorrow to go on vacation to the beach and to [place].  Explained to her that I was not comfortable just calling in this prescription.  We did not have a Pap smear on file for her.  She says she understands.

## 2017-12-04 ENCOUNTER — Ambulatory Visit (INDEPENDENT_AMBULATORY_CARE_PROVIDER_SITE_OTHER): Payer: 59 | Admitting: Internal Medicine

## 2017-12-04 ENCOUNTER — Ambulatory Visit
Admission: RE | Admit: 2017-12-04 | Discharge: 2017-12-04 | Disposition: A | Discharge status: Home / Self Care | Payer: 59 | Source: Ambulatory Visit | Attending: Internal Medicine | Admitting: Internal Medicine

## 2017-12-04 ENCOUNTER — Encounter: Payer: Self-pay | Admitting: Internal Medicine

## 2017-12-04 VITALS — BP 130/80 | HR 70 | Ht 65.5 in | Wt 142.0 lb

## 2017-12-04 DIAGNOSIS — S99911A Unspecified injury of right ankle, initial encounter: Secondary | ICD-10-CM

## 2017-12-04 DIAGNOSIS — W19XXXA Unspecified fall, initial encounter: Secondary | ICD-10-CM | POA: Diagnosis not present

## 2017-12-04 DIAGNOSIS — M25571 Pain in right ankle and joints of right foot: Secondary | ICD-10-CM

## 2017-12-04 NOTE — Progress Notes (Signed)
   Subjective:    Patient ID: Brittney Cannon, female    DOB: [birth year], 42 y.o.   MRN: [id number]  HPI About 8 weeks ago patient was really.  She tripped over her son's snow boots and landed flat on her deck.  She hurt her right ankle pop and felt immediate pain.  It did not swell all that much.  However it has continued to be painful.  She is tried to run walk and soon will be playing tennis.  It still bothering her.  It never bruised.    Review of Systems see above     Objective:   Physical Exam  She has tenderness right lateral malleolus extending down to the metatarsal.  Good range of motion with the foot.  No swelling or bruising.      Assessment & Plan:  She has not had x-ray.  She will go for an x-ray to see if she has a small fibula avulsion with probable right ankle sprain.  Plan: We will advise further after x-ray.

## 2017-12-04 NOTE — Patient Instructions (Signed)
Have Xray of right ankle

## 2018-01-20 ENCOUNTER — Telehealth: Payer: Self-pay | Admitting: Internal Medicine

## 2018-01-20 DIAGNOSIS — M25571 Pain in right ankle and joints of right foot: Secondary | ICD-10-CM

## 2018-01-20 DIAGNOSIS — W19XXXA Unspecified fall, initial encounter: Secondary | ICD-10-CM

## 2018-01-20 NOTE — Telephone Encounter (Signed)
Faxed medical records to [facility] 1st 6 pages, 2nd 65 pages,collected $75

## 2018-01-27 ENCOUNTER — Telehealth: Payer: Self-pay | Admitting: Internal Medicine

## 2018-01-27 NOTE — Telephone Encounter (Signed)
Patient is out of [place] in [place].  She has been sick with a bad URI.  She had Levaquin on hand and took a 7 day treatment.  She finished this about 1 1/2 weeks ago.  She has continued to have a bad cough.  Wanted to know if it's ok to take Delsym around the clock every 8 hours??  No fever since she finished the antibiotics.  The cough is in her chest, non productive.  She will be home Saturday for 1 day only, then returning to [place].    Phone:  [phone]  [facility] [place] on [address]

## 2018-01-29 NOTE — Telephone Encounter (Signed)
Spoke with Dr. Lenord FellersBaxley and she states that she is NOT going to call her anything in.  In fact, patient should NOT be taking Delsym every 8 hours.  She should be taking it every 12 hours.  And, she may need a repeat antibiotic.  However, she is not willing to call this in for her to [place] because she feels that someone needs to listen to her lungs.  We have to be sure the patient doesn't have pneumonia.  She needs to go to an Urgent Care there in [place] and be seen.    Spoke with patient this morning.  Patient is on her way home.  States that she can no longer take the Delsym because it is hurting her stomach at this point.  And, she knows that she needs to be seen.  She has made an appointment for Friday morning, 6/7 at 11:45 to be seen for the cough.  She feels she may need a steroid and something stronger for the cough.  We will see her tomorrow for evaluation.

## 2018-01-30 ENCOUNTER — Ambulatory Visit (INDEPENDENT_AMBULATORY_CARE_PROVIDER_SITE_OTHER): Payer: 59 | Admitting: Internal Medicine

## 2018-01-30 ENCOUNTER — Encounter: Payer: Self-pay | Admitting: Internal Medicine

## 2018-01-30 VITALS — BP 120/70 | HR 62 | Temp 98.9°F | Ht 65.5 in | Wt 140.0 lb

## 2018-01-30 DIAGNOSIS — J22 Unspecified acute lower respiratory infection: Secondary | ICD-10-CM | POA: Diagnosis not present

## 2018-01-30 MED ORDER — TERCONAZOLE 0.4 % VA CREA: 1.0000 | TOPICAL_CREAM | Freq: Every day | VAGINAL | 0 refills | Status: DC

## 2018-01-30 MED ORDER — BENZONATATE 100 MG PO CAPS: 100.0000 mg | ORAL_CAPSULE | Freq: Three times a day (TID) | ORAL | 1 refills | Status: DC | PRN

## 2018-01-30 MED ORDER — LEVOFLOXACIN 500 MG PO TABS: 500.0000 mg | ORAL_TABLET | Freq: Every day | ORAL | 1 refills | Status: DC

## 2018-01-30 MED ORDER — METHYLPREDNISOLONE ACETATE 80 MG/ML IJ SUSP
80.0000 mg | Freq: Once | INTRAMUSCULAR | Status: AC
  Administered 2018-01-30: 80 mg via INTRAMUSCULAR

## 2018-02-20 NOTE — Progress Notes (Signed)
   Subjective:    Patient ID: Brittney Cannon, female    DOB: 03/02/1976, 42 y.o.   MRN: [id number]  HPI Patient in today with history of URI symptoms around May 17 or 18.  Subsequently around June 7 developed face and ear tenderness.  Was dizzy.  Had discolored nasal drainage and chest congestion.  Had a temperature of 102 degrees.  Has been traveling.    Review of Systems see above     Objective:   Physical Exam Has congested cough.  TMs slightly full bilaterally but not red.  Pharynx slightly injected.  Neck is supple without  adenopathy.  Chest clear to auscultation without rales or wheezing.       Assessment & Plan:  Acute lower respiratory infection  Plan: Tessalon Perles 100 mg 3 times daily as needed for cough.  Levaquin 500 mg daily for 10 days with 1 refill.  She will be traveling some the summer with her family.  Rest and drink plenty of fluids.

## 2018-02-20 NOTE — Patient Instructions (Signed)
Levaquin 500 mg daily for 10 days.  Tessalon Perles 100 mg 3 times daily as needed for cough.  Rest and drink plenty of fluids.

## 2018-04-10 ENCOUNTER — Ambulatory Visit: Payer: 59 | Admitting: Internal Medicine

## 2018-04-14 ENCOUNTER — Encounter: Payer: Self-pay | Admitting: Internal Medicine

## 2018-04-14 ENCOUNTER — Ambulatory Visit (INDEPENDENT_AMBULATORY_CARE_PROVIDER_SITE_OTHER): Payer: 59 | Admitting: Internal Medicine

## 2018-04-14 DIAGNOSIS — M545 Low back pain, unspecified: Secondary | ICD-10-CM

## 2018-04-14 DIAGNOSIS — M542 Cervicalgia: Secondary | ICD-10-CM

## 2018-04-14 MED ORDER — ALPRAZOLAM 0.5 MG PO TABS: ORAL_TABLET | ORAL | 1 refills | Status: DC

## 2018-04-14 MED ORDER — ALPRAZOLAM 0.5 MG PO TABS: ORAL_TABLET | ORAL | 0 refills | Status: DC
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
## 2018-04-14 NOTE — Patient Instructions (Addendum)
Take Advil 600 mg twice daily.  Have x-rays of neck and LS spine.  Order written for physical therapy.  Apply ice or heat to neck and back.

## 2018-04-29 ENCOUNTER — Telehealth: Payer: Self-pay | Admitting: Internal Medicine

## 2018-04-29 MED ORDER — CYCLOBENZAPRINE HCL 10 MG PO TABS: 10.0000 mg | ORAL_TABLET | Freq: Three times a day (TID) | ORAL | 0 refills | Status: DC
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
## 2018-07-16 ENCOUNTER — Other Ambulatory Visit: Payer: Self-pay | Admitting: Internal Medicine

## 2018-08-22 IMAGING — CR DG CHEST 2V
2 series · 2 of 2 positions shown · non-contrast
Comparison: None.

CLINICAL DATA: Dyspnea 1 week

EXAM:
CHEST  2 VIEW

[w chest pa]
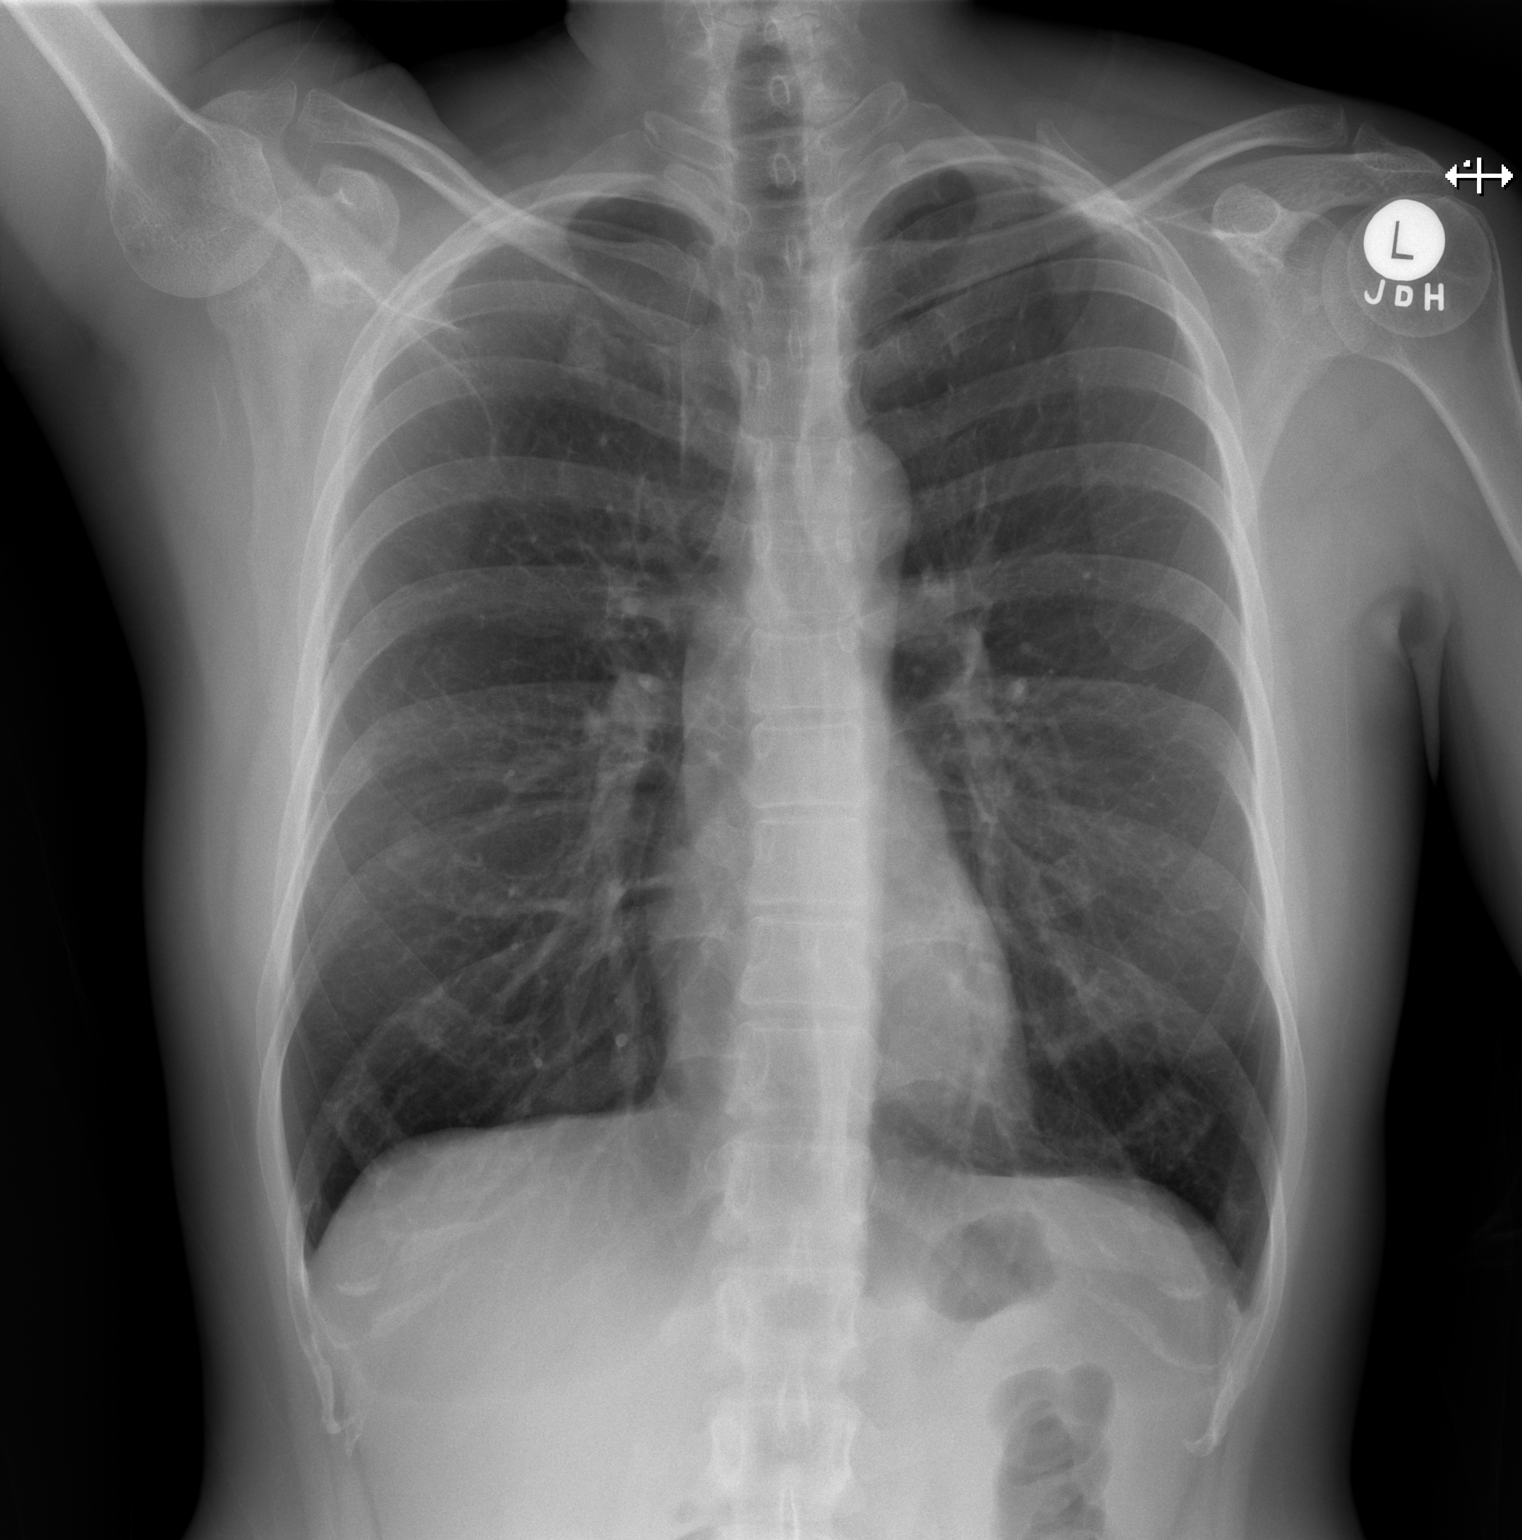

[w chest lat]
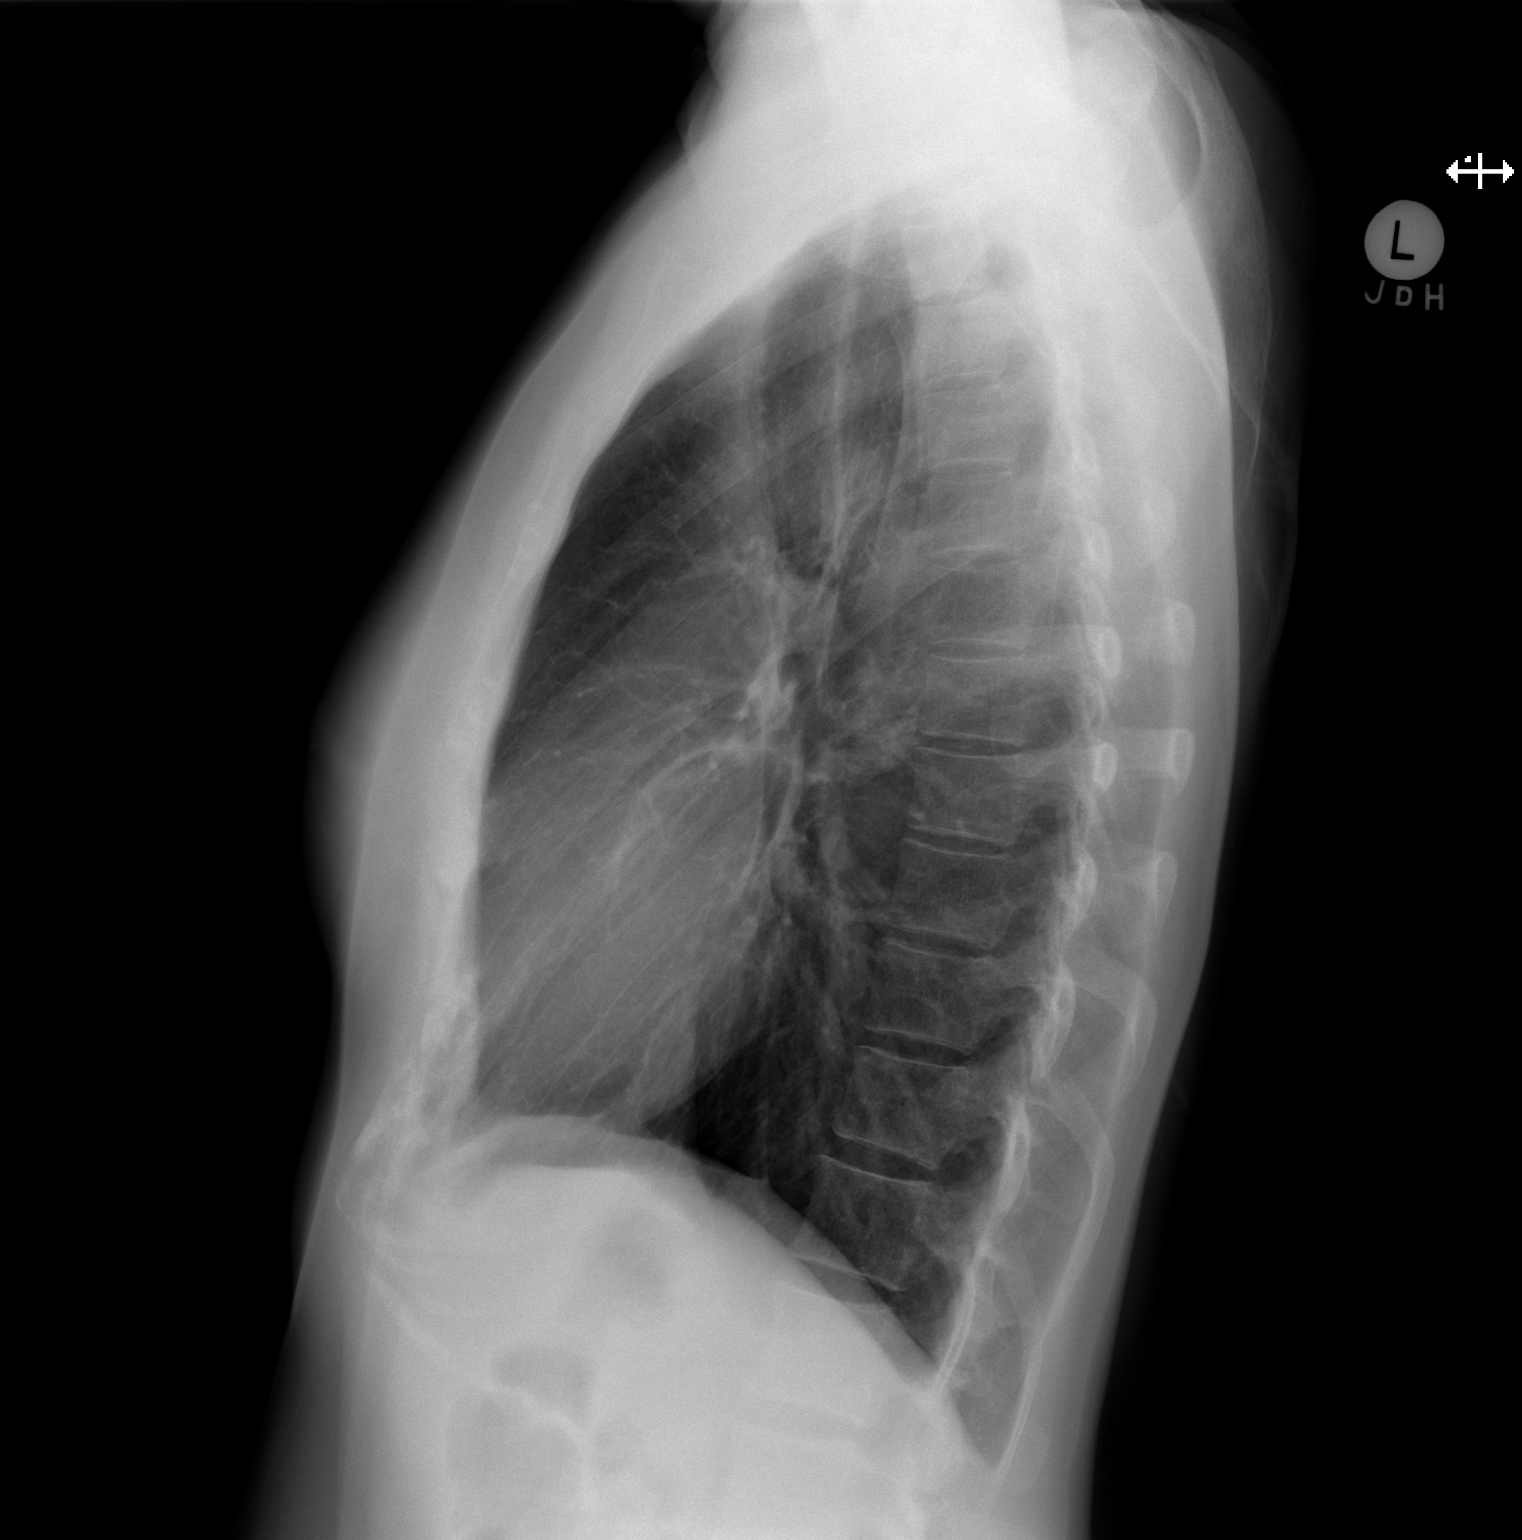

[2 of 2 positions shown; findings below may reference images not displayed]

FINDINGS: Cardiac and mediastinal contours normal. Negative for pneumonia.
Negative for heart failure or effusion

Nodular density right lung apex. This could represent the first rib
however is asymmetric and nodule is possible.
IMPRESSION: Negative for pneumonia

Nodular density right lung apex. As no prior studies are available,
CT chest without contrast recommended for further evaluation.

These results will be called to the ordering clinician or
representative by the Radiologist Assistant, and communication
documented in the PACS or zVision Dashboard.

## 2018-08-24 IMAGING — CT CT CHEST W/ CM
2 of 3 series · 15 of 36 positions shown, 18 images · IV contrast (APPLIED)
Comparison: Chest x-ray from 09/01/2017

CLINICAL DATA: Possible nodular density in the right upper lobe on
recent chest x-ray

EXAM:
CT CHEST WITH CONTRAST
TECHNIQUE: Multidetector CT imaging of the chest was performed during
intravenous contrast administration.
CONTRAST:  75mL MLLTFA-HXX IOPAMIDOL (MLLTFA-HXX) INJECTION 61%

[Series 2: chest w/cm · axial · 0.63mm/px · z∈[-247,+23]mm · 12 of 159 slices shown, 15 images]
[im 12/159  mediastinal]
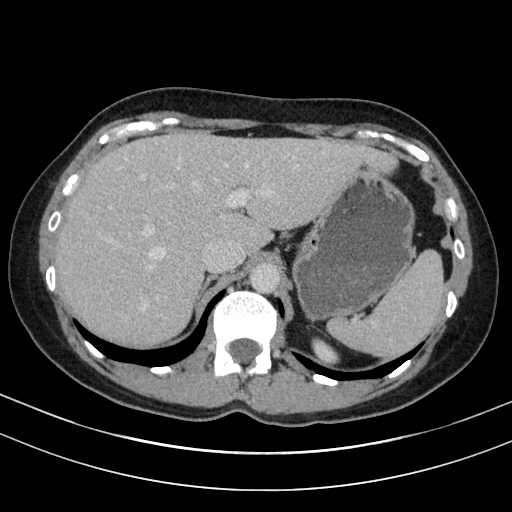
[im 12/159  lung]
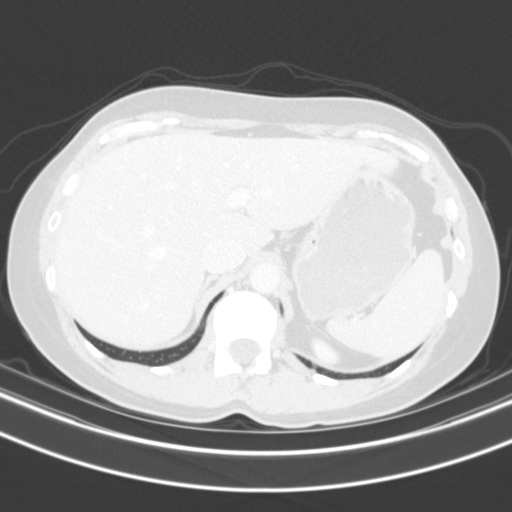
[im 24/159  lung]
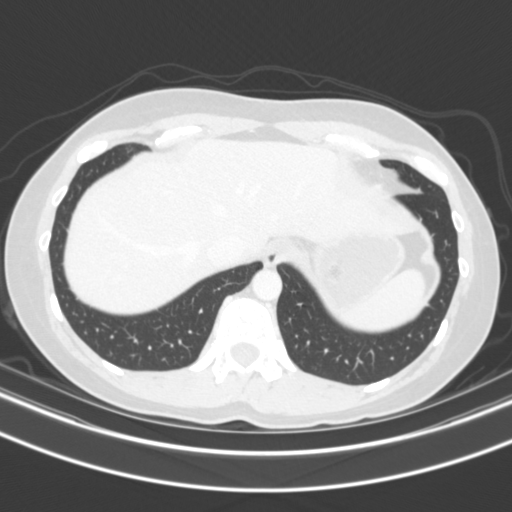
[im 36/159  lung]
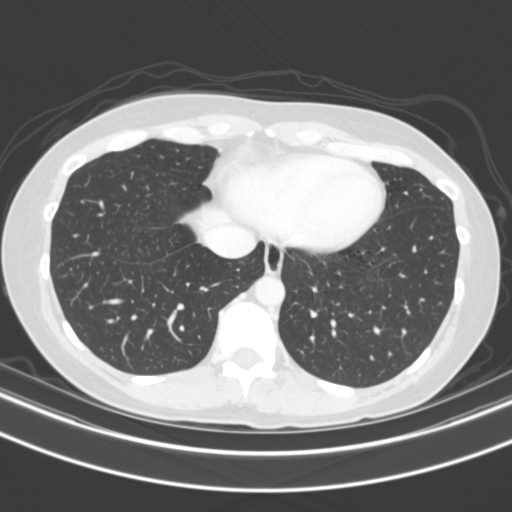
[im 47/159  lung]
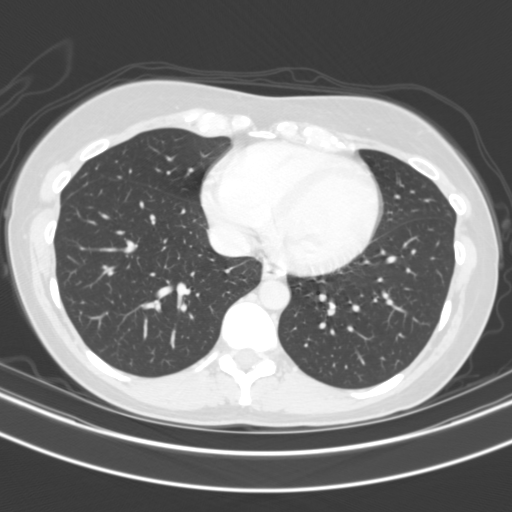
[im 59/159  mediastinal]
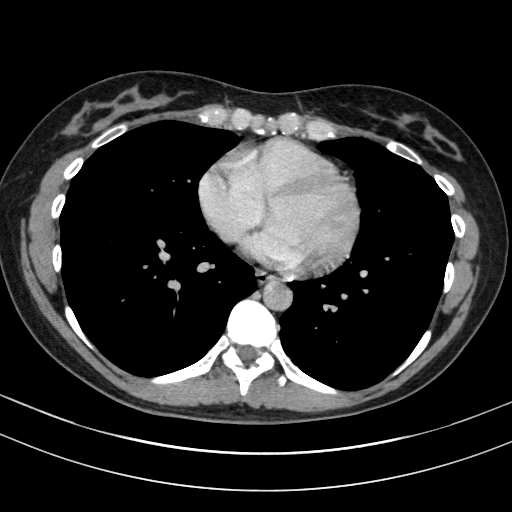
[im 59/159  lung]
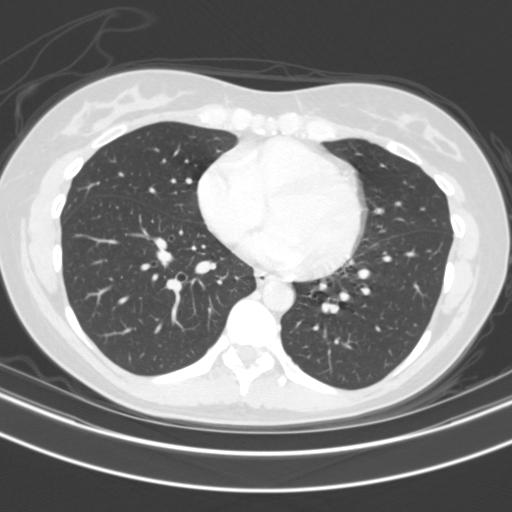
[im 71/159  lung]
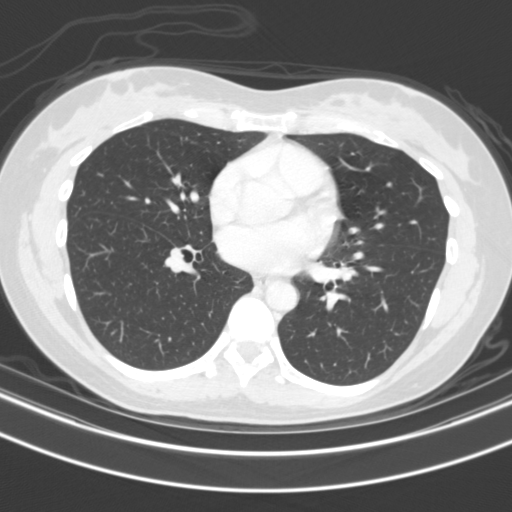
[im 88/159  lung]
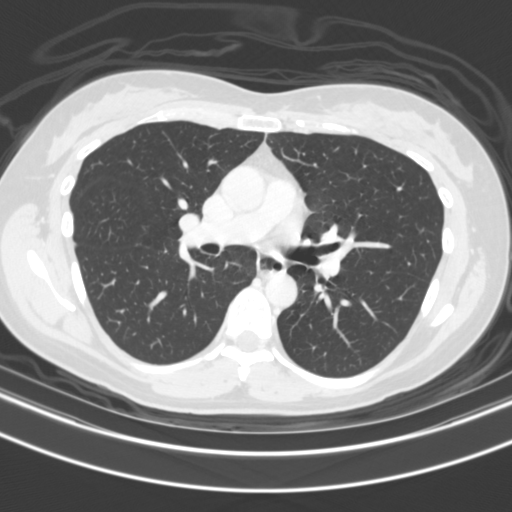
[im 100/159  lung]
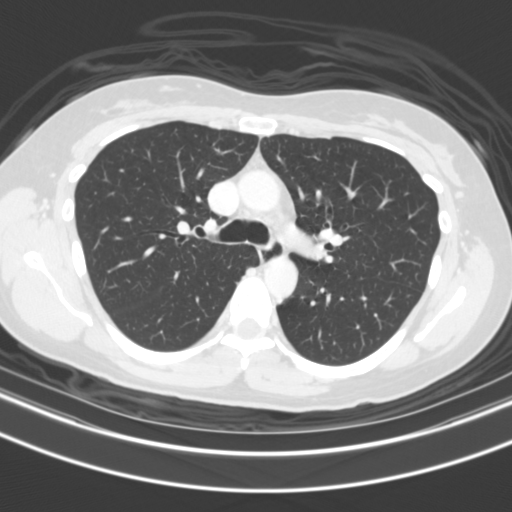
[im 112/159  mediastinal]
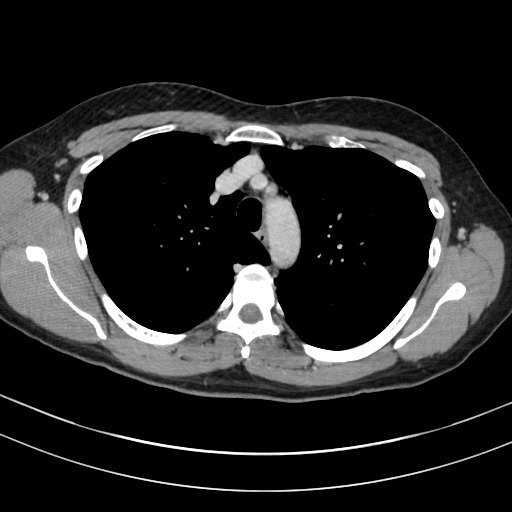
[im 112/159  lung]
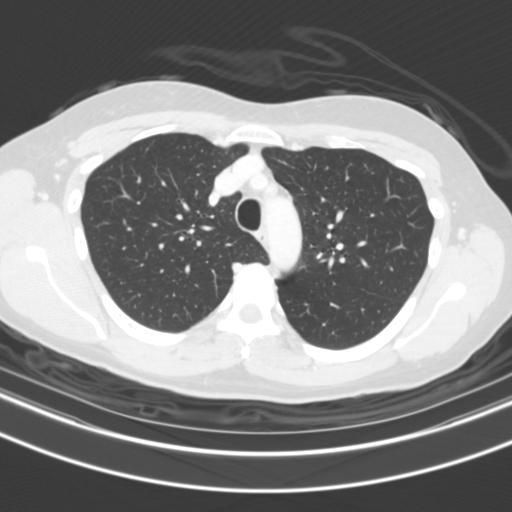
[im 123/159  lung]
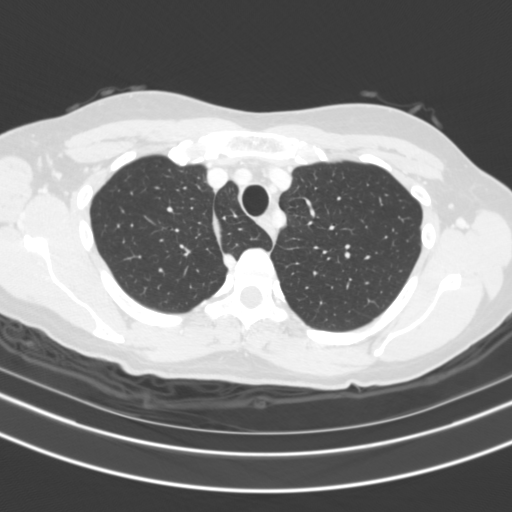
[im 135/159  lung]
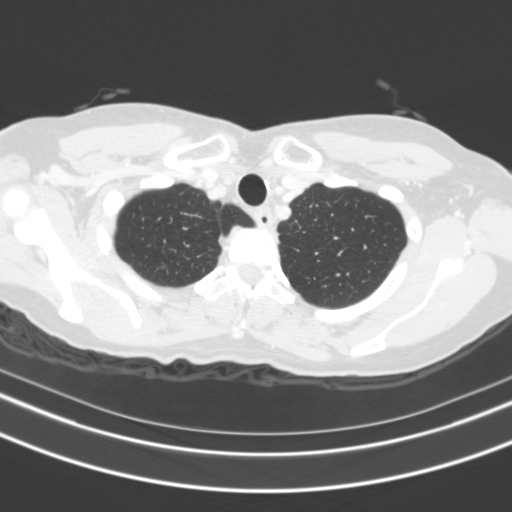
[im 147/159  lung]
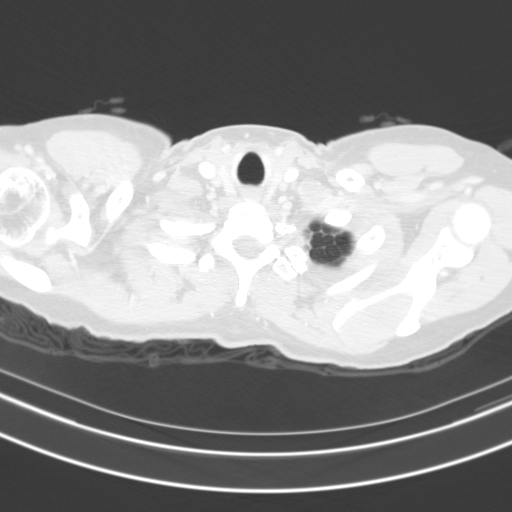

[Series 3: cor · coronal · 0.62mm/px · 3 of 109 slices shown]
[im 22/109  lung]
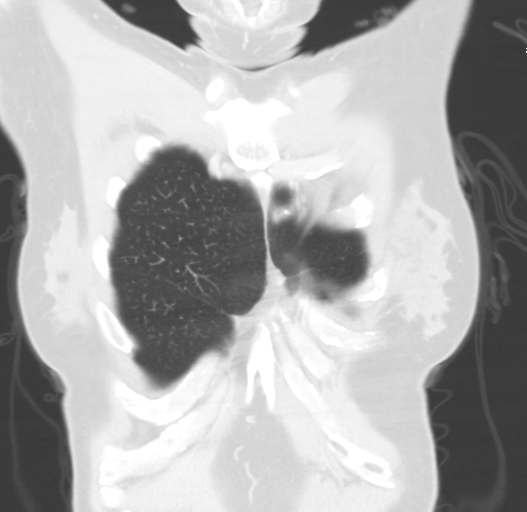
[im 44/109  lung]
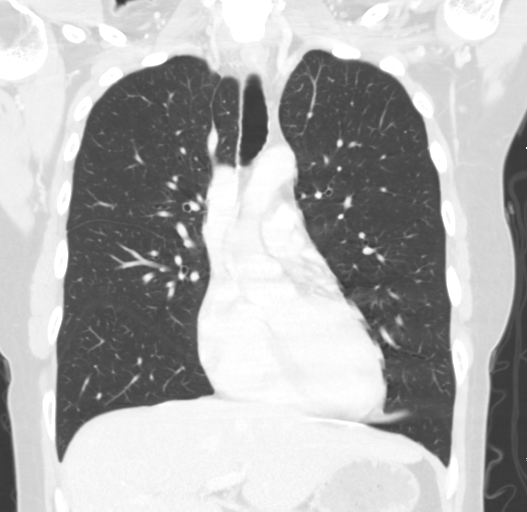
[im 65/109  lung]
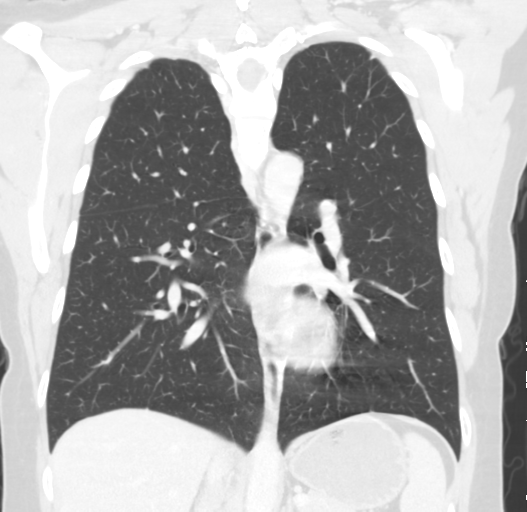

[15 of 36 positions shown; findings below may reference images not displayed]

FINDINGS: Cardiovascular: The thoracic aorta and pulmonary artery are within
normal limits as visualized. No cardiac enlargement is seen. No
significant coronary calcifications are noted.

Mediastinum/Nodes: The thoracic inlet is within normal limits. No
hilar or mediastinal adenopathy is noted. The esophagus is
unremarkable.

Lungs/Pleura: Azygos lobe is noted. The lungs are well aerated
bilaterally. No focal nodular density is identified. The changes
seen on recent chest x-ray are representative of the anterior aspect
of the right first rib.

Upper Abdomen: Within normal limits.

Musculoskeletal: No chest wall abnormality. No acute or significant
osseous findings.
IMPRESSION: No nodular density is identified to correspond to the abnormality
seen on recent chest x-ray. The chest x-ray findings are consistent
with the anterior aspect of the right first rib.

No other focal abnormality is noted.

## 2018-09-23 ENCOUNTER — Telehealth: Payer: Self-pay | Admitting: Internal Medicine

## 2018-09-23 NOTE — Telephone Encounter (Signed)
I do not believe in recurrent or reactivated EB virus but happy to see her for this illness tomorrow.
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
## 2018-09-23 NOTE — Telephone Encounter (Signed)
Appointment provided to patient for 1/30 at 11:15 a.m.  Patient confirmed.

## 2018-09-24 ENCOUNTER — Ambulatory Visit: Payer: 59 | Admitting: Internal Medicine

## 2018-09-25 ENCOUNTER — Ambulatory Visit (INDEPENDENT_AMBULATORY_CARE_PROVIDER_SITE_OTHER): Payer: 59 | Admitting: Internal Medicine

## 2018-09-25 ENCOUNTER — Encounter: Payer: Self-pay | Admitting: Internal Medicine

## 2018-09-25 VITALS — BP 120/70 | HR 111 | Temp 98.8°F | Ht 65.5 in | Wt 142.0 lb

## 2018-09-25 DIAGNOSIS — J0100 Acute maxillary sinusitis, unspecified: Secondary | ICD-10-CM | POA: Diagnosis not present

## 2018-09-25 MED ORDER — METHYLPREDNISOLONE ACETATE 80 MG/ML IJ SUSP
80.0000 mg | Freq: Once | INTRAMUSCULAR | Status: AC
  Administered 2018-09-25: 80 mg via INTRAMUSCULAR

## 2018-09-25 MED ORDER — LEVOFLOXACIN 500 MG PO TABS: 500.0000 mg | ORAL_TABLET | Freq: Every day | ORAL | 0 refills | Status: DC

## 2018-09-25 NOTE — Patient Instructions (Signed)
Depo-Medrol 80 mg IM.  Levaquin 500 mg daily for 10 days.
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
## 2018-10-20 ENCOUNTER — Telehealth: Payer: Self-pay | Admitting: Internal Medicine

## 2018-10-20 DIAGNOSIS — Z7721 Contact with and (suspected) exposure to potentially hazardous body fluids: Secondary | ICD-10-CM

## 2018-10-20 MED ORDER — OSELTAMIVIR PHOSPHATE 75 MG PO CAPS: 75.0000 mg | ORAL_CAPSULE | Freq: Every day | ORAL | 0 refills | Status: DC

## 2018-10-20 NOTE — Telephone Encounter (Signed)
Patient calling in because she was told to by her son who we had already sent in tamiflu for and she needs some as well. Sending message to MD

## 2018-10-20 NOTE — Telephone Encounter (Signed)
Rx sent in

## 2018-10-20 NOTE — Telephone Encounter (Signed)
Called in Tamiflu 75 mg daily x 5 days. Pt should get flu vaccine next year. Has not had one.

## 2019-03-01 ENCOUNTER — Ambulatory Visit (INDEPENDENT_AMBULATORY_CARE_PROVIDER_SITE_OTHER): Payer: 59 | Admitting: Internal Medicine

## 2019-03-01 ENCOUNTER — Other Ambulatory Visit: Payer: Self-pay

## 2019-03-01 ENCOUNTER — Telehealth: Payer: Self-pay | Admitting: Internal Medicine

## 2019-03-01 DIAGNOSIS — Z7184 Encounter for health counseling related to travel: Secondary | ICD-10-CM | POA: Diagnosis not present

## 2019-03-01 DIAGNOSIS — Z8659 Personal history of other mental and behavioral disorders: Secondary | ICD-10-CM

## 2019-03-01 DIAGNOSIS — Z87898 Personal history of other specified conditions: Secondary | ICD-10-CM

## 2019-03-01 MED ORDER — SCOPOLAMINE 1 MG/3DAYS TD PT72: 1.0000 | MEDICATED_PATCH | TRANSDERMAL | 0 refills | Status: DC

## 2019-03-01 MED ORDER — ALPRAZOLAM 0.5 MG PO TABS: ORAL_TABLET | ORAL | 0 refills | Status: DC

## 2019-03-01 MED ORDER — LEVOFLOXACIN 500 MG PO TABS: 500.0000 mg | ORAL_TABLET | Freq: Every day | ORAL | 0 refills | Status: DC

## 2019-03-01 NOTE — Telephone Encounter (Signed)
Scheduled

## 2019-03-01 NOTE — Telephone Encounter (Signed)
Schedule virtual visit on in house visit

## 2019-03-01 NOTE — Telephone Encounter (Signed)
Brittney Cannon [phone]  [facility] [address]   Nausea ear patch levofloxacin (LEVAQUIN) 500 MG tablet ALPRAZolam (XANAX) 0.5 MG tablet       [place] called to say she is going on vacation and she would like to get the ear patches you have called in for her in the past for nausea plus other medications.

## 2019-03-01 NOTE — Telephone Encounter (Signed)
Schedule virtual visit 

## 2019-03-20 ENCOUNTER — Encounter: Payer: Self-pay | Admitting: Internal Medicine

## 2019-03-20 NOTE — Patient Instructions (Addendum)
Have prescribed Transderm-Scop for motion sickness, Levaquin if needed for respiratory or urinary tract infection and have refilled Xanax for anxiety.
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
## 2019-03-29 ENCOUNTER — Other Ambulatory Visit: Payer: Self-pay

## 2019-03-29 ENCOUNTER — Ambulatory Visit (INDEPENDENT_AMBULATORY_CARE_PROVIDER_SITE_OTHER): Payer: 59 | Admitting: Internal Medicine

## 2019-03-29 DIAGNOSIS — R438 Other disturbances of smell and taste: Secondary | ICD-10-CM | POA: Diagnosis not present

## 2019-04-10 NOTE — Patient Instructions (Signed)
Call if symptoms persist
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
## 2019-05-25 ENCOUNTER — Telehealth: Payer: Self-pay | Admitting: Internal Medicine

## 2019-05-25 ENCOUNTER — Other Ambulatory Visit: Payer: Self-pay

## 2019-05-25 ENCOUNTER — Ambulatory Visit (INDEPENDENT_AMBULATORY_CARE_PROVIDER_SITE_OTHER): Payer: 59 | Admitting: Internal Medicine

## 2019-05-25 ENCOUNTER — Encounter: Payer: Self-pay | Admitting: Internal Medicine

## 2019-05-25 VITALS — BP 120/80 | HR 90 | Temp 98.0°F | Ht 65.5 in | Wt 150.0 lb

## 2019-05-25 DIAGNOSIS — L255 Unspecified contact dermatitis due to plants, except food: Secondary | ICD-10-CM

## 2019-05-25 MED ORDER — TRIAMCINOLONE ACETONIDE 0.1 % EX CREA: 1.0000 "application " | TOPICAL_CREAM | Freq: Two times a day (BID) | CUTANEOUS | 0 refills | Status: DC

## 2019-05-25 MED ORDER — PREDNISONE 10 MG PO TABS: ORAL_TABLET | ORAL | 0 refills | Status: DC

## 2019-05-25 NOTE — Patient Instructions (Signed)
Have prescribed prednisone 10 mg number 21 tablets to take in tapering course over 6 days.  She says she will keep this on hand for future use.  However patient declined Depo-Medrol 80 mg IM.  Have given her prescription for triamcinolone cream 0.1% to use sparingly on skin lesions 3 times a day.

## 2019-05-25 NOTE — Progress Notes (Signed)
   Subjective:    Patient ID: Brittney Cannon, female    DOB: 1975-10-07, 43 y.o.   MRN: [id number]  HPI 43 year old Female in with rash on arms and legs x one week.  Says she has been out in the woods looking for trees and plants for her yard.  Thinks she has poison ivy.  These lesions are itchy.  Says son was helping her and had similar rash.  Says that she had a steroid Dosepak on hand but gave it to her son.  Review of Systems no fever or chills.     Objective:   Physical Exam  She has small erythematous papular discrete lesions on arms that really do not look like poison ivy.  They are not vesicular.  They do not have a linear appearance.  They almost look more like insect bites to me but she does not think that is the case.      Assessment & Plan:  Contact dermatitis versus insect bites  Plan: We discussed injection of Depo-Medrol 80 mg IM but she declined.  I have refilled her steroid Dosepak prednisone 10 mg going from 60 mg to 0 mg over 7 days.  I have prescribed triamcinolone cream 0.1% to use sparingly on individual lesions 3 times a day.

## 2019-05-25 NOTE — Telephone Encounter (Signed)
Pt wants call from nurse to discuss getting into poison oak

## 2019-07-06 ENCOUNTER — Telehealth: Payer: Self-pay | Admitting: Internal Medicine

## 2019-07-06 DIAGNOSIS — N946 Dysmenorrhea, unspecified: Secondary | ICD-10-CM

## 2019-07-06 MED ORDER — NAPROXEN 500 MG PO TABS: 500.0000 mg | ORAL_TABLET | Freq: Two times a day (BID) | ORAL | 99 refills | Status: DC

## 2019-07-06 NOTE — Telephone Encounter (Signed)
Rx sent in

## 2019-07-06 NOTE — Telephone Encounter (Signed)
Patient requesting Rx for Naproxen for dysmenorrhea. Refill x one year. Does not like OTC Anaprox. MJB.MD

## 2019-07-06 NOTE — Telephone Encounter (Signed)
Shinita Mac [phone]  [place]  Naproxin  Marce called to see if she could get a prescription sent in for above medication.

## 2019-08-30 ENCOUNTER — Telehealth: Payer: Self-pay | Admitting: Internal Medicine

## 2019-08-30 NOTE — Telephone Encounter (Addendum)
Patient has appointment at [facility] tomorrow at 10:00, however plan on leaving on Wednesday to go to [employer] house to quarantine.

## 2019-08-30 NOTE — Telephone Encounter (Signed)
Virtual visit after results are back. Stay quarantined at home until results are back.

## 2019-08-30 NOTE — Telephone Encounter (Signed)
Brittney Cannon [phone]  Shatha called to say she has dry cough, sneezing, runny-stuffy nose started feeling bad Saturday after Christmas. Her son and girlfriend that lives with her has tested positive to COVID, she is going to try and get an appointment to be tested at [facility], would like virtual visit.

## 2019-08-30 NOTE — Telephone Encounter (Signed)
If this is Covid, antibiotics are not indicated.  We need to know results of Covid test before we see her. She should be quarantined at present either here or beach at least until results are back.

## 2019-08-31 ENCOUNTER — Ambulatory Visit: Payer: 59 | Attending: Internal Medicine

## 2019-08-31 DIAGNOSIS — Z20822 Contact with and (suspected) exposure to covid-19: Secondary | ICD-10-CM

## 2019-08-31 DIAGNOSIS — U071 COVID-19: Secondary | ICD-10-CM

## 2019-08-31 HISTORY — DX: COVID-19: U07.1

## 2019-09-02 ENCOUNTER — Other Ambulatory Visit: Payer: Self-pay

## 2019-09-02 ENCOUNTER — Ambulatory Visit (INDEPENDENT_AMBULATORY_CARE_PROVIDER_SITE_OTHER): Payer: 59 | Admitting: Internal Medicine

## 2019-09-02 ENCOUNTER — Encounter: Payer: Self-pay | Admitting: Internal Medicine

## 2019-09-02 VITALS — Ht 65.5 in | Wt 150.0 lb

## 2019-09-02 DIAGNOSIS — U071 COVID-19: Secondary | ICD-10-CM | POA: Diagnosis not present

## 2019-09-02 LAB — NOVEL CORONAVIRUS, NAA: SARS-CoV-2, NAA: DETECTED — AB

## 2019-09-02 MED ORDER — LEVOFLOXACIN 500 MG PO TABS: 500.0000 mg | ORAL_TABLET | Freq: Every day | ORAL | 0 refills | Status: DC

## 2019-09-02 MED ORDER — PREDNISONE 10 MG PO TABS: ORAL_TABLET | ORAL | 0 refills | Status: DC

## 2019-09-02 NOTE — Progress Notes (Signed)
   Subjective:    Patient ID: Gean Maidens, female    DOB: 05-Jul-1976, 45 y.o.   MRN: [id number]  HPI Patient seen today by interactive audio and video telecommunications due to the Coronavirus pandemic.  She is identified using 2 identifiers as Shylah Dossantos. [place], a patient in this practice.  She is agreeable to visit in this format today.  Patient tells me her son is tested positive for COVID-19.  She recently tested positive herself for COVID-19.  She is complaining of nasal congestion ear congestion and cough.  However she drove to the beach and is staying at her beach house.    Review of Systems complaining of flulike symptoms with fatigue malaise and myalgias and cough     Objective:   Physical Exam Seen virtually lying in bed.   Does not have a pulse ox device Does not appear to be tachypneic.  Appears slightly pale.  Sounds nasally congested.      Assessment & Plan:  COVID-19 infection  Plan: Patient appears to be stable at present time.  Needs to get pulse ox device to monitor her oxygen levels and take her temperature frequently.  She prefers antibiotics and steroids.  We will start her on Levaquin 500 mg daily for 10 days.  Will treat with tapering course of prednisone starting with 60 mg decreasing to 0 mg over 7 days.  Rest and drink plenty of fluids and stay well-hydrated.

## 2019-09-02 NOTE — Telephone Encounter (Signed)
Scheduled virtual Visit °

## 2019-09-13 NOTE — Telephone Encounter (Signed)
Faxed positive COVID-19 results to [facility] 

## 2019-09-19 ENCOUNTER — Encounter: Payer: Self-pay | Admitting: Internal Medicine

## 2019-09-19 NOTE — Patient Instructions (Signed)
Rest and drink plenty of fluids.  Stay well-hydrated.  Monitor pulse oximetry and temperature.  Call if not doing well.  Levaquin 500 mg daily for 10 days.  Take prednisone in tapering course as directed.

## 2019-12-31 ENCOUNTER — Other Ambulatory Visit: Payer: Self-pay

## 2019-12-31 ENCOUNTER — Encounter (HOSPITAL_BASED_OUTPATIENT_CLINIC_OR_DEPARTMENT_OTHER): Payer: Self-pay | Admitting: Obstetrics and Gynecology

## 2019-12-31 ENCOUNTER — Other Ambulatory Visit: Payer: Self-pay | Admitting: Obstetrics and Gynecology

## 2019-12-31 ENCOUNTER — Other Ambulatory Visit (HOSPITAL_COMMUNITY)
Admission: RE | Admit: 2019-12-31 | Discharge: 2019-12-31 | Disposition: A | Discharge status: Home / Self Care | Payer: 59 | Source: Ambulatory Visit | Attending: Obstetrics and Gynecology | Admitting: Obstetrics and Gynecology

## 2019-12-31 DIAGNOSIS — Z01812 Encounter for preprocedural laboratory examination: Secondary | ICD-10-CM | POA: Diagnosis not present

## 2019-12-31 DIAGNOSIS — Z20822 Contact with and (suspected) exposure to covid-19: Secondary | ICD-10-CM | POA: Insufficient documentation

## 2019-12-31 LAB — SARS CORONAVIRUS 2 (TAT 6-24 HRS): SARS Coronavirus 2: NEGATIVE

## 2019-12-31 NOTE — Progress Notes (Signed)
Spoke w/ via phone for pre-op interview---patient Lab needs dos----    cbc          COVID test ------12-31-2019 Arrive at -------700 am 01-04-2020 NPO after ------midnight Medications to take morning of surgery -----alprazolam prn, nature thryoid, prednisone Diabetic medication -----n/a Patient Special Instructions -----none Pre-Op special Istructions -----none Patient verbalized understanding of instructions that were given at this phone interview. Patient denies shortness of breath, chest pain, fever, cough a this phone interview.

## 2020-01-04 ENCOUNTER — Ambulatory Visit (HOSPITAL_BASED_OUTPATIENT_CLINIC_OR_DEPARTMENT_OTHER): Admission: RE | Admit: 2020-01-04 | Payer: 59 | Source: Home / Self Care | Admitting: Obstetrics and Gynecology

## 2020-01-04 HISTORY — DX: Irritable bowel syndrome, unspecified: K58.9

## 2020-01-04 HISTORY — DX: Family history of other specified conditions: Z84.89

## 2020-01-04 SURGERY — DILATION AND EVACUATION, UTERUS
Anesthesia: Choice

## 2020-01-14 ENCOUNTER — Other Ambulatory Visit (HOSPITAL_COMMUNITY)
Admission: RE | Admit: 2020-01-14 | Discharge: 2020-01-14 | Disposition: A | Discharge status: Home / Self Care | Payer: 59 | Source: Ambulatory Visit | Attending: Obstetrics and Gynecology | Admitting: Obstetrics and Gynecology

## 2020-01-14 ENCOUNTER — Other Ambulatory Visit: Payer: Self-pay | Admitting: Obstetrics and Gynecology

## 2020-01-14 ENCOUNTER — Other Ambulatory Visit: Payer: Self-pay

## 2020-01-14 ENCOUNTER — Encounter (HOSPITAL_BASED_OUTPATIENT_CLINIC_OR_DEPARTMENT_OTHER): Payer: Self-pay | Admitting: Obstetrics and Gynecology

## 2020-01-14 DIAGNOSIS — Z01812 Encounter for preprocedural laboratory examination: Secondary | ICD-10-CM | POA: Insufficient documentation

## 2020-01-14 DIAGNOSIS — Z20822 Contact with and (suspected) exposure to covid-19: Secondary | ICD-10-CM | POA: Diagnosis not present

## 2020-01-14 NOTE — Progress Notes (Addendum)
Spoke w/ via phone for pre-op interview---patient Lab needs dos----  cbc          COVID test ------01-14-2020@200  pm Arrive at -------820 am NPO after ------midnight Medications to take morning of surgery -----alprazolam, nature thyroid, prednisone Diabetic medication -----n/a Patient Special Instructions -----none Pre-Op special Istructions -----none Patient verbalized understanding of instructions that were given at this phone interview. Patient denies shortness of breath, chest pain, fever, cough a this phone interview.

## 2020-01-15 LAB — SARS CORONAVIRUS 2 (TAT 6-24 HRS): SARS Coronavirus 2: NEGATIVE

## 2020-01-17 ENCOUNTER — Ambulatory Visit (HOSPITAL_BASED_OUTPATIENT_CLINIC_OR_DEPARTMENT_OTHER): Payer: 59 | Admitting: Anesthesiology

## 2020-01-17 ENCOUNTER — Encounter (HOSPITAL_BASED_OUTPATIENT_CLINIC_OR_DEPARTMENT_OTHER)
Admission: RE | Disposition: A | Discharge status: Home / Self Care | Payer: Self-pay | Source: Home / Self Care | Attending: Obstetrics and Gynecology

## 2020-01-17 ENCOUNTER — Ambulatory Visit (HOSPITAL_BASED_OUTPATIENT_CLINIC_OR_DEPARTMENT_OTHER)
Admission: RE | Admit: 2020-01-17 | Discharge: 2020-01-17 | Disposition: A | Discharge status: Home / Self Care | Payer: 59 | Attending: Obstetrics and Gynecology | Admitting: Obstetrics and Gynecology

## 2020-01-17 ENCOUNTER — Other Ambulatory Visit: Payer: Self-pay

## 2020-01-17 ENCOUNTER — Encounter (HOSPITAL_BASED_OUTPATIENT_CLINIC_OR_DEPARTMENT_OTHER): Payer: Self-pay | Admitting: Obstetrics and Gynecology

## 2020-01-17 DIAGNOSIS — Z8616 Personal history of COVID-19: Secondary | ICD-10-CM | POA: Insufficient documentation

## 2020-01-17 DIAGNOSIS — M8580 Other specified disorders of bone density and structure, unspecified site: Secondary | ICD-10-CM | POA: Insufficient documentation

## 2020-01-17 DIAGNOSIS — O021 Missed abortion: Secondary | ICD-10-CM | POA: Diagnosis not present

## 2020-01-17 DIAGNOSIS — K589 Irritable bowel syndrome without diarrhea: Secondary | ICD-10-CM | POA: Diagnosis not present

## 2020-01-17 DIAGNOSIS — E039 Hypothyroidism, unspecified: Secondary | ICD-10-CM | POA: Insufficient documentation

## 2020-01-17 HISTORY — PX: DILATION AND EVACUATION: SHX1459

## 2020-01-17 LAB — CBC
HCT: 43.8 % (ref 36.0–46.0)
Hemoglobin: 14.6 g/dL (ref 12.0–15.0)
MCH: 33.4 pg (ref 26.0–34.0)
MCHC: 33.3 g/dL (ref 30.0–36.0)
MCV: 100.2 fL — ABNORMAL HIGH (ref 80.0–100.0)
Platelets: 279 10*3/uL (ref 150–400)
RBC: 4.37 MIL/uL (ref 3.87–5.11)
RDW: 11.9 % (ref 11.5–15.5)
WBC: 5.3 10*3/uL (ref 4.0–10.5)
nRBC: 0 % (ref 0.0–0.2)

## 2020-01-17 SURGERY — DILATION AND EVACUATION, UTERUS
Anesthesia: Monitor Anesthesia Care | Site: Vagina

## 2020-01-17 MED ORDER — HYDROMORPHONE HCL 2 MG PO TABS
2.0000 mg | ORAL_TABLET | Freq: Once | ORAL | Status: AC
  Administered 2020-01-17: 2 mg via ORAL

## 2020-01-17 MED ORDER — FENTANYL CITRATE (PF) 100 MCG/2ML IJ SOLN
INTRAMUSCULAR | Status: AC
  Filled 2020-01-17: qty 2

## 2020-01-17 MED ORDER — KETOROLAC TROMETHAMINE 30 MG/ML IJ SOLN
INTRAMUSCULAR | Status: DC | PRN
  Administered 2020-01-17: 30 mg via INTRAVENOUS

## 2020-01-17 MED ORDER — PROPOFOL 500 MG/50ML IV EMUL
INTRAVENOUS | Status: AC
  Filled 2020-01-17: qty 50

## 2020-01-17 MED ORDER — MIDAZOLAM HCL 5 MG/5ML IJ SOLN
INTRAMUSCULAR | Status: DC | PRN
  Administered 2020-01-17 (×2): 2 mg via INTRAVENOUS

## 2020-01-17 MED ORDER — DEXAMETHASONE SODIUM PHOSPHATE 10 MG/ML IJ SOLN
INTRAMUSCULAR | Status: DC | PRN
  Administered 2020-01-17: 10 mg via INTRAVENOUS

## 2020-01-17 MED ORDER — METHYLERGONOVINE MALEATE 0.2 MG/ML IJ SOLN
INTRAMUSCULAR | Status: AC
  Filled 2020-01-17: qty 1

## 2020-01-17 MED ORDER — HYDROMORPHONE HCL 1 MG/ML IJ SOLN
0.2500 mg | INTRAMUSCULAR | Status: DC | PRN
  Administered 2020-01-17 (×2): 0.5 mg via INTRAVENOUS

## 2020-01-17 MED ORDER — CEFAZOLIN SODIUM-DEXTROSE 2-4 GM/100ML-% IV SOLN: 2.0000 g | INTRAVENOUS | Status: DC

## 2020-01-17 MED ORDER — LIDOCAINE 2% (20 MG/ML) 5 ML SYRINGE
INTRAMUSCULAR | Status: DC | PRN
  Administered 2020-01-17: 60 mg via INTRAVENOUS

## 2020-01-17 MED ORDER — HYDROMORPHONE HCL 2 MG PO TABS
ORAL_TABLET | ORAL | Status: AC
  Filled 2020-01-17: qty 1

## 2020-01-17 MED ORDER — PROPOFOL 10 MG/ML IV BOLUS
INTRAVENOUS | Status: AC
  Filled 2020-01-17: qty 20

## 2020-01-17 MED ORDER — HYDROMORPHONE HCL 1 MG/ML IJ SOLN
INTRAMUSCULAR | Status: AC
  Filled 2020-01-17: qty 1

## 2020-01-17 MED ORDER — BUPIVACAINE HCL 0.25 % IJ SOLN
INTRAMUSCULAR | Status: DC | PRN
  Administered 2020-01-17: 10 mL

## 2020-01-17 MED ORDER — PROPOFOL 10 MG/ML IV BOLUS
INTRAVENOUS | Status: DC | PRN
  Administered 2020-01-17: 40 mg via INTRAVENOUS
  Administered 2020-01-17: 30 mg via INTRAVENOUS

## 2020-01-17 MED ORDER — OXYCODONE HCL 5 MG/5ML PO SOLN: 5.0000 mg | Freq: Once | ORAL | Status: DC | PRN

## 2020-01-17 MED ORDER — DEXAMETHASONE SODIUM PHOSPHATE 10 MG/ML IJ SOLN
INTRAMUSCULAR | Status: AC
  Filled 2020-01-17: qty 1

## 2020-01-17 MED ORDER — MIDAZOLAM HCL 2 MG/2ML IJ SOLN
INTRAMUSCULAR | Status: AC
  Filled 2020-01-17: qty 2

## 2020-01-17 MED ORDER — ONDANSETRON HCL 4 MG/2ML IJ SOLN
INTRAMUSCULAR | Status: DC | PRN
  Administered 2020-01-17: 4 mg via INTRAVENOUS

## 2020-01-17 MED ORDER — LIDOCAINE 2% (20 MG/ML) 5 ML SYRINGE
INTRAMUSCULAR | Status: AC
  Filled 2020-01-17: qty 5

## 2020-01-17 MED ORDER — OXYCODONE HCL 5 MG PO TABS: 5.0000 mg | ORAL_TABLET | Freq: Once | ORAL | Status: DC | PRN

## 2020-01-17 MED ORDER — KETOROLAC TROMETHAMINE 30 MG/ML IJ SOLN
INTRAMUSCULAR | Status: AC
  Filled 2020-01-17: qty 1

## 2020-01-17 MED ORDER — LIDOCAINE HCL (PF) 1 % IJ SOLN
INTRAMUSCULAR | Status: DC | PRN
  Administered 2020-01-17: 20 mL

## 2020-01-17 MED ORDER — ONDANSETRON HCL 4 MG/2ML IJ SOLN: 4.0000 mg | Freq: Once | INTRAMUSCULAR | Status: DC | PRN

## 2020-01-17 MED ORDER — ONDANSETRON HCL 4 MG/2ML IJ SOLN
INTRAMUSCULAR | Status: AC
  Filled 2020-01-17: qty 2

## 2020-01-17 MED ORDER — LACTATED RINGERS IV SOLN: INTRAVENOUS | Status: DC

## 2020-01-17 MED ORDER — PROPOFOL 500 MG/50ML IV EMUL
INTRAVENOUS | Status: DC | PRN
  Administered 2020-01-17: 150 ug/kg/min via INTRAVENOUS

## 2020-01-17 SURGICAL SUPPLY — 27 items
BAG DRN RND TRDRP ANRFLXCHMBR (UROLOGICAL SUPPLIES)
BAG URINE DRAIN 2000ML AR STRL (UROLOGICAL SUPPLIES) IMPLANT
CATH FOLEY 2WAY SLVR 30CC 16FR (CATHETERS) IMPLANT
CATH ROBINSON RED A/P 16FR (CATHETERS) IMPLANT
COVER WAND RF STERILE (DRAPES) ×3 IMPLANT
FILTER UTR ASPR ASSEMBLY (MISCELLANEOUS) ×3 IMPLANT
GLOVE BIO SURGEON STRL SZ7.5 (GLOVE) ×3 IMPLANT
GOWN STRL REUS W/TWL XL LVL3 (GOWN DISPOSABLE) ×3 IMPLANT
HOSE CONNECTING 18IN BERKELEY (TUBING) ×3 IMPLANT
KIT BERKELEY 1ST TRI 3/8 NO TR (MISCELLANEOUS) IMPLANT
KIT BERKELEY 1ST TRIMESTER 3/8 (MISCELLANEOUS) ×6 IMPLANT
KIT TURNOVER CYSTO (KITS) ×3 IMPLANT
NS IRRIG 500ML POUR BTL (IV SOLUTION) ×2 IMPLANT
PACK VAGINAL MINOR WOMEN LF (CUSTOM PROCEDURE TRAY) ×3 IMPLANT
PAD OB MATERNITY 4.3X12.25 (PERSONAL CARE ITEMS) IMPLANT
PAD PREP 24X48 CUFFED NSTRL (MISCELLANEOUS) ×3 IMPLANT
SCOPETTES 8  STERILE (MISCELLANEOUS)
SCOPETTES 8 STERILE (MISCELLANEOUS) IMPLANT
SET BERKELEY SUCTION TUBING (SUCTIONS) ×3 IMPLANT
TOWEL OR 17X26 10 PK STRL BLUE (TOWEL DISPOSABLE) ×3 IMPLANT
TUBE CONNECTING 12'X1/4 (SUCTIONS) ×1
TUBE CONNECTING 12X1/4 (SUCTIONS) ×1 IMPLANT
VACURETTE 10 RIGID CVD (CANNULA) IMPLANT
VACURETTE 7MM CVD STRL WRAP (CANNULA) ×2 IMPLANT
VACURETTE 8 RIGID CVD (CANNULA) IMPLANT
VACURETTE 9 RIGID CVD (CANNULA) IMPLANT
WATER STERILE IRR 500ML POUR (IV SOLUTION) ×3 IMPLANT

## 2020-01-17 NOTE — H&P (Signed)
Brittney Cannon is an 44 y.o. female. Sono confirmed MAB for D&E. Declines medical or expectant management options.   Pertinent Gynecological History: Menses: flow is moderate Bleeding: na Contraception: none DES exposure: denies Blood transfusions: none Sexually transmitted diseases: no past history Previous GYN Procedures: DNC  Last mammogram: na Date: na Last pap: normal Date: 2020 OB History: G4, P2   Menstrual History: Menarche age: 32 No LMP recorded.    Past Medical History:  Diagnosis Date  . COVID-19 08/31/2019   cold symptoms, sob, loss of taste and smell, all symptoms resolved with quaranatine  . Family history of adverse reaction to anesthesia    mother slow to awaken, takes days  . Hypothyroidism   . IBS (irritable bowel syndrome)   . Osteopenia     Past Surgical History:  Procedure Laterality Date  . CESAREAN SECTION  2009  . TONSILLECTOMY  2003    Family History  Problem Relation Age of Onset  . Osteoporosis Mother   . Osteoporosis Sister   . Osteoporosis Maternal Grandmother     Social History:  reports that she has never smoked. She has never used smokeless tobacco. She reports previous alcohol use. She reports that she does not use drugs.  Allergies:  Allergies  Allergen Reactions  . Doxycycline Nausea And Vomiting    Patient reported  . Azithromycin     rash  . Codeine Nausea And Vomiting  . Erythromycin Nausea And Vomiting  . Lactose Intolerance (Gi)   . Latex Dermatitis  . Penicillins Hives  . Tramadol Itching    No medications prior to admission.    Review of Systems  Constitutional: Negative.   All other systems reviewed and are negative.   Height 5\' 6"  (1.676 m), weight 65.8 kg. Physical Exam  Nursing note and vitals reviewed. Constitutional: She is oriented to person, place, and time. She appears well-developed and well-nourished.  HENT:  Head: Normocephalic and atraumatic.  Cardiovascular: Normal rate and regular  rhythm.  Respiratory: Effort normal and breath sounds normal.  GI: Soft. Bowel sounds are normal.  Genitourinary:    Vagina and uterus normal.   Musculoskeletal:        General: Normal range of motion.     Cervical back: Normal range of motion and neck supple.  Neurological: She is alert and oriented to person, place, and time. She has normal reflexes.  Skin: Skin is warm and dry.  Psychiatric: She has a normal mood and affect.    No results found for this or any previous visit (from the past 24 hour(s)).  No results found.  Assessment/Plan: MAB- sono confirmed. Nonsurgical options discussed. Consent done. Suction D&E. Surgical risks discussed.   Abron Neddo J 01/17/2020, 6:35 AM

## 2020-01-17 NOTE — Op Note (Signed)
01/17/2020  10:34 AM  PATIENT:  Brittney Cannon  44 y.o. female  PRE-OPERATIVE DIAGNOSIS:  Missed Abortion  POST-OPERATIVE DIAGNOSIS:  Missed Abortion  PROCEDURE:  Procedure(s): DILATATION AND EVACUATION  SURGEON:  Surgeon(s): Olivia Mackie, MD  ASSISTANTS: none   ANESTHESIA:   local and IV sedation  ESTIMATED BLOOD LOSS: minimal   DRAINS: none   LOCAL MEDICATIONS USED:  MARCAINE   , LIDOCAINE  and Amount: 30 ml  SPECIMEN:  Source of Specimen:  Poc  DISPOSITION OF SPECIMEN:  PATHOLOGY  COUNTS:  YES  DICTATION #: [id number]  PLAN OF CARE: dc home  PATIENT DISPOSITION:  PACU - hemodynamically stable.

## 2020-01-17 NOTE — Anesthesia Procedure Notes (Signed)
Procedure Name: MAC Date/Time: 01/17/2020 10:21 AM Performed by: Bonney Aid, CRNA Pre-anesthesia Checklist: Patient identified, Timeout performed, Suction available, Emergency Drugs available and Patient being monitored Patient Re-evaluated:Patient Re-evaluated prior to induction Oxygen Delivery Method: Nasal cannula Placement Confirmation: positive ETCO2

## 2020-01-17 NOTE — Discharge Instructions (Signed)
°  Post Anesthesia Home Care Instructions  Activity: Get plenty of rest for the remainder of the day. A responsible adult should stay with you for 24 hours following the procedure.  For the next 24 hours, DO NOT: -Drive a car -Advertising copywriter -Drink alcoholic beverages -Take any medication unless instructed by your physician -Make any legal decisions or sign important papers.  Meals: Start with liquid foods such as gelatin or soup. Progress to regular foods as tolerated. Avoid greasy, spicy, heavy foods. If nausea and/or vomiting occur, drink only clear liquids until the nausea and/or vomiting subsides. Call your physician if vomiting continues.  Special Instructions/Symptoms: Your throat may feel dry or sore from the anesthesia or the breathing tube placed in your throat during surgery. If this causes discomfort, gargle with warm salt water. The discomfort should disappear within 24 hours.  If you had a scopolamine patch placed behind your ear for the management of post- operative nausea and/or vomiting:  1. The medication in the patch is effective for 72 hours, after which it should be removed.  Wrap patch in a tissue and discard in the trash. Wash hands thoroughly with soap and water. 2. You may remove the patch earlier than 72 hours if you experience unpleasant side effects which may include dry mouth, dizziness or visual disturbances. 3. Avoid touching the patch. Wash your hands with soap and water after contact with the patch.    No advil, aleve, motrin, ibuprofen until 430 pm today   DISCHARGE INSTRUCTIONS: D&C / D&E The following instructions have been prepared to help you care for yourself upon your return home.   Personal hygiene:  Use sanitary pads for vaginal drainage, not tampons.  Shower the day after your procedure.  NO tub baths, pools or Jacuzzis for 2-3 weeks.  Wipe front to back after using the bathroom.  Activity and limitations:  Do NOT drive or operate  any equipment for 24 hours. The effects of anesthesia are still present and drowsiness may result.  Do NOT rest in bed all day.  Walking is encouraged.  Walk up and down stairs slowly.  You may resume your normal activity in one to two days or as indicated by your physician.  Sexual activity: NO intercourse for at least 2 weeks after the procedure, or as indicated by your physician.  Diet: Eat a light meal as desired this evening. You may resume your usual diet tomorrow.  Return to work: You may resume your work activities in one to two days or as indicated by your doctor.  What to expect after your surgery: Expect to have vaginal bleeding/discharge for 2-3 days and spotting for up to 10 days. It is not unusual to have soreness for up to 1-2 weeks. You may have a slight burning sensation when you urinate for the first day. Mild cramps may continue for a couple of days. You may have a regular period in 2-6 weeks.  Call your doctor for any of the following:  Excessive vaginal bleeding, saturating and changing one pad every hour.  Inability to urinate 6 hours after discharge from hospital.  Pain not relieved by pain medication.  Fever of 100.4 F or greater.  Unusual vaginal discharge or odor.   Call for an appointment: in 4 weeks  Resume lovenox in 24 hrs.

## 2020-01-17 NOTE — Transfer of Care (Signed)
Immediate Anesthesia Transfer of Care Note  Patient: Brittney Cannon  Procedure(s) Performed: DILATATION AND EVACUATION (N/A Vagina )  Patient Location: PACU  Anesthesia Type:MAC  Level of Consciousness: drowsy  Airway & Oxygen Therapy: Patient Spontanous Breathing and Patient connected to nasal cannula oxygen  Post-op Assessment: Report given to RN  Post vital signs: Reviewed and stable  Last Vitals:  Vitals Value Taken Time  BP 121/76 01/17/20 1038  Temp 36.9 C 01/17/20 1039  Pulse 74 01/17/20 1039  Resp 23 01/17/20 1039  SpO2 100 % 01/17/20 1039  Vitals shown include unvalidated device data.  Last Pain:  Vitals:   01/17/20 1039  TempSrc: Oral  PainSc:       Patients Stated Pain Goal: 3 (20/10/07 1219)  Complications: No apparent anesthesia complications

## 2020-01-17 NOTE — Anesthesia Preprocedure Evaluation (Addendum)
Anesthesia Evaluation  Patient identified by MRN, date of birth, ID band Patient awake    Reviewed: Allergy & Precautions, NPO status , Patient's Chart, lab work & pertinent test results  History of Anesthesia Complications Negative for: history of anesthetic complications  Airway Mallampati: I  TM Distance: >3 FB Neck ROM: Full    Dental  (+) Teeth Intact   Pulmonary neg pulmonary ROS,  Mild, symptomatic COVID-19 infection Jan 2021   Pulmonary exam normal        Cardiovascular negative cardio ROS Normal cardiovascular exam     Neuro/Psych negative neurological ROS  negative psych ROS   GI/Hepatic negative GI ROS, Neg liver ROS,   Endo/Other  Hypothyroidism   Renal/GU negative Renal ROS  negative genitourinary   Musculoskeletal negative musculoskeletal ROS (+)   Abdominal   Peds  Hematology negative hematology ROS (+)   Anesthesia Other Findings    Reproductive/Obstetrics                           Anesthesia Physical Anesthesia Plan  ASA: II  Anesthesia Plan: MAC   Post-op Pain Management:    Induction: Intravenous  PONV Risk Score and Plan: 2 and Propofol infusion, TIVA and Treatment may vary due to age or medical condition  Airway Management Planned: Natural Airway, Nasal Cannula and Simple Face Mask  Additional Equipment: None  Intra-op Plan:   Post-operative Plan:   Informed Consent: I have reviewed the patients History and Physical, chart, labs and discussed the procedure including the risks, benefits and alternatives for the proposed anesthesia with the patient or authorized representative who has indicated his/her understanding and acceptance.       Plan Discussed with:   Anesthesia Plan Comments:        Anesthesia Quick Evaluation

## 2020-01-17 NOTE — Anesthesia Postprocedure Evaluation (Signed)
Anesthesia Post Note  Patient: Sandrea Matte  Procedure(s) Performed: DILATATION AND EVACUATION (N/A Vagina )     Patient location during evaluation: PACU Anesthesia Type: MAC Level of consciousness: awake and alert Pain management: pain level controlled Vital Signs Assessment: post-procedure vital signs reviewed and stable Respiratory status: spontaneous breathing, nonlabored ventilation and respiratory function stable Cardiovascular status: blood pressure returned to baseline and stable Postop Assessment: no apparent nausea or vomiting Anesthetic complications: no    Last Vitals:  Vitals:   01/17/20 1113 01/17/20 1114  BP:    Pulse: 87 86  Resp: 11 13  Temp:  36.7 C  SpO2: 100% 100%    Last Pain:  Vitals:   01/17/20 1114  TempSrc: Oral  PainSc:                  Lidia Collum

## 2020-01-17 NOTE — Op Note (Signed)
NAMEKEISY, Brittney Cannon MEDICAL RECORD [id number] ACCOUNT [id number] DATE OF BIRTH:07-22-1976 FACILITY: [facility] LOCATION: [facility] PHYSICIAN:Mareesa Gathright J. Dmitri Pettigrew, MD  OPERATIVE REPORT  DATE OF PROCEDURE:  01/17/2020  PREOPERATIVE DIAGNOSES:  Missed abortion at 6 weeks, postop pain.  POSTOPERATIVE DIAGNOSES:  Missed abortion at 6 weeks, postop pain.  PROCEDURE:  Suction dilation and evacuation.  SURGEON:  Olivia Mackie, MD  ASSISTANT:  None  ANESTHESIA:  Local and IV sedation.  ESTIMATED BLOOD LOSS:  Less than 15 mL.  COMPLICATIONS:  None.  DRAINS:  None.  COUNTS:  Correct.  DISPOSITION:  The patient was taken to recovery in good condition.  BRIEF OPERATIVE NOTE:  After being apprised of the risks of anesthesia, infection, bleeding, injury to surrounding organs, possible need for repair, delayed versus immediate complications including bowel and bladder, internal vessel injury, possible need  for repair, patient was brought to the operating room where she was administered IV sedation without difficulty, prepped and draped in sterile fashion, catheterized until the bladder was empty.  Exam under anesthesia reveals a bulky anteflexed uterus  and no adnexal masses.  At this time, cervix was grasped using single tooth tenaculum.  Dilute Marcaine solution Novocain solution placed.  Standard paracervical block and anterior lip of the cervix.  Cervix easily dilated up to 21 Pratt dilator and  suction and evacuation initiated into the green zone with a 7 mm suction curette.  Products of conception are aspirated and noted.  Repeat suction and blunt curettage revealed the cavity to be empty.  Good hemostasis was noted.  The patient tolerated the  procedure well and was transferred to recovery in good condition.  CN/NUANCE  D:01/17/2020 T:01/17/2020 JOB:[id number]

## 2020-01-18 LAB — SURGICAL PATHOLOGY

## 2020-04-06 ENCOUNTER — Telehealth: Payer: Self-pay

## 2020-04-06 ENCOUNTER — Ambulatory Visit (INDEPENDENT_AMBULATORY_CARE_PROVIDER_SITE_OTHER): Payer: BC Managed Care – PPO | Admitting: Internal Medicine

## 2020-04-06 VITALS — Temp 98.7°F

## 2020-04-06 DIAGNOSIS — J0100 Acute maxillary sinusitis, unspecified: Secondary | ICD-10-CM

## 2020-04-06 DIAGNOSIS — M9904 Segmental and somatic dysfunction of sacral region: Secondary | ICD-10-CM | POA: Diagnosis not present

## 2020-04-06 DIAGNOSIS — R0981 Nasal congestion: Secondary | ICD-10-CM

## 2020-04-06 DIAGNOSIS — H6503 Acute serous otitis media, bilateral: Secondary | ICD-10-CM | POA: Diagnosis not present

## 2020-04-06 DIAGNOSIS — M9901 Segmental and somatic dysfunction of cervical region: Secondary | ICD-10-CM | POA: Diagnosis not present

## 2020-04-06 DIAGNOSIS — M9903 Segmental and somatic dysfunction of lumbar region: Secondary | ICD-10-CM | POA: Diagnosis not present

## 2020-04-06 DIAGNOSIS — M9902 Segmental and somatic dysfunction of thoracic region: Secondary | ICD-10-CM | POA: Diagnosis not present

## 2020-04-06 MED ORDER — LEVOFLOXACIN 500 MG PO TABS
500.0000 mg | ORAL_TABLET | Freq: Every day | ORAL | 0 refills | Status: DC
Start: 2020-04-06 — End: 2020-07-18

## 2020-04-06 MED ORDER — PREDNISONE 10 MG PO TABS
ORAL_TABLET | ORAL | 0 refills | Status: DC
Start: 2020-04-06 — End: 2020-08-31

## 2020-04-06 NOTE — Telephone Encounter (Signed)
Patient called feels like she has a cold, her ears are clogged. She did two rapid covid tests and they were both negative, she would like to be seen.   Call back number [phone]

## 2020-04-06 NOTE — Telephone Encounter (Signed)
Schedule car visit 

## 2020-04-06 NOTE — Telephone Encounter (Signed)
Scheduled

## 2020-04-06 NOTE — Progress Notes (Signed)
° °  Subjective:    Patient ID: Brittney Cannon, female    DOB: [birth year], 44 y.o.   MRN: [id number]  HPI 44 year old Female presents with nasal congestion for several weeks.  No discolored nasal drainage but has postnasal drip.  No documented fever.  On May 24, she had D&E by Dr. Billy Coast.  She had Covid testing at [facility] on May 21 and she says that personnel there  went far up into her left nasal passage causing some discomfort and bleeding.  Today patient is afebrile.  She feels like she has a respiratory infection and and her ears are clogged.  She has had 2 rapid Covid tests recently and they were both negative.  Due to the COVID-19 pandemic, patient was seen outside my office adjacent to her automobile.  My CMA was there to assist me.  Patient tested positive for Covid-19 by history in January 2021.  See visit January 2021.  Review of Systems see above denies dysgeusia, fever, shaking chills, myalgias  Has not had COVID-19 vaccine for our records.  Patient has been restoring a farmhouse and has been exposed to dust recently.     Objective:   Physical Exam Afebrile.  Pharynx is clear.  Both TMs are full bilaterally but not red.  Her neck is supple.  Chest is clear to auscultation without rales or wheezing.       Assessment & Plan:   Acute maxillary sinusitis  Bilateral serous otitis media  Rule out COVID-19 infection-we obtained COVID-19 test sample from right nostril during this visit  Plan: Levaquin 500 mg daily for 10 days. Prednisone 10 mg (#21) tablets going from 60 mg to 0 mg over 7 days  Addendum: April 09, 2019 advised patient her COVID-19 PCR test is negative

## 2020-04-07 ENCOUNTER — Telehealth: Payer: Self-pay | Admitting: Internal Medicine

## 2020-04-07 ENCOUNTER — Encounter: Payer: Self-pay | Admitting: Internal Medicine

## 2020-04-07 LAB — SARS-COV-2 RNA,(COVID-19) QUALITATIVE NAAT: SARS CoV2 RNA: NOT DETECTED

## 2020-04-07 NOTE — Telephone Encounter (Signed)
Phone call to patient. Covid-19 test is negative

## 2020-04-08 ENCOUNTER — Encounter: Payer: Self-pay | Admitting: Internal Medicine

## 2020-04-08 NOTE — Patient Instructions (Signed)
COVID-19 test obtained via right nostril.  Take Levaquin 500 mg daily for 10 days and prednisone 10 mg in tapering course starting with 60 mg and decreasing by 10 mg over 7 days.

## 2020-05-25 DIAGNOSIS — M9904 Segmental and somatic dysfunction of sacral region: Secondary | ICD-10-CM | POA: Diagnosis not present

## 2020-05-25 DIAGNOSIS — M9903 Segmental and somatic dysfunction of lumbar region: Secondary | ICD-10-CM | POA: Diagnosis not present

## 2020-05-25 DIAGNOSIS — M9901 Segmental and somatic dysfunction of cervical region: Secondary | ICD-10-CM | POA: Diagnosis not present

## 2020-05-25 DIAGNOSIS — M9902 Segmental and somatic dysfunction of thoracic region: Secondary | ICD-10-CM | POA: Diagnosis not present

## 2020-07-18 ENCOUNTER — Telehealth: Payer: Self-pay | Admitting: Internal Medicine

## 2020-07-18 ENCOUNTER — Other Ambulatory Visit: Payer: Self-pay

## 2020-07-18 ENCOUNTER — Telehealth (INDEPENDENT_AMBULATORY_CARE_PROVIDER_SITE_OTHER): Payer: BC Managed Care – PPO | Admitting: Internal Medicine

## 2020-07-18 ENCOUNTER — Encounter: Payer: Self-pay | Admitting: Internal Medicine

## 2020-07-18 DIAGNOSIS — H1031 Unspecified acute conjunctivitis, right eye: Secondary | ICD-10-CM | POA: Diagnosis not present

## 2020-07-18 MED ORDER — TERCONAZOLE 0.4 % VA CREA: 1.0000 | TOPICAL_CREAM | Freq: Every day | VAGINAL | 1 refills | Status: DC

## 2020-07-18 MED ORDER — OFLOXACIN 0.3 % OP SOLN
1.0000 [drp] | Freq: Four times a day (QID) | OPHTHALMIC | 0 refills | Status: DC
Start: 2020-07-18 — End: 2020-09-02

## 2020-07-18 MED ORDER — LEVOFLOXACIN 500 MG PO TABS
500.0000 mg | ORAL_TABLET | Freq: Every day | ORAL | 0 refills | Status: DC
Start: 2020-07-18 — End: 2020-08-31

## 2020-07-18 NOTE — Patient Instructions (Addendum)
Apply warm hot compresses for 20 minutes to right 2 or 3 times daily.  Ofloxacin ophthalmic drops 2 drops in right eye 4 times a day for 5 to 7 days.  Levaquin 500 mg daily for 10 days for?  Blocked tear duct.  If not improving within a few days she will need to see ophthalmologist.  Radene Ou 7 vaginal cream prescribed should she develop Candida vaginitis while on antibiotics.

## 2020-07-18 NOTE — Telephone Encounter (Signed)
OK 

## 2020-07-18 NOTE — Telephone Encounter (Signed)
Scheduled

## 2020-07-18 NOTE — Telephone Encounter (Signed)
Brittney Cannon [phone]  [name] called and would like a virtual visit for her right eye being itchy in the corner for the last 2-3 weeks she also said that the tear duct is swollen. I let her know this may need to be in office visit so you can see her eye hands on.

## 2020-07-18 NOTE — Progress Notes (Addendum)
   Subjective:    Patient ID: Brittney Cannon, female    DOB: 28-May-1976, 44 y.o.   MRN: [id number]  HPI 44 year old Female seen today via interactive audio and video telecommunications.  She is identified using 2 identifiers as Brittney Cannon. [name], a patient in this practice.  She is agreeable to visit in this format today and actually requested it.  She is in her home and I am at my office.  For 3 weeks she has had irritation right medial eye.  Said at times it was painful, not crusted, able to make tears but extremely itchy.  Has had no change in make-up.  Some 3 months ago a piece of bark struck her eye while she was doing some yard work.  She did not seek medical attention at that time.  Says there is no crusting/drainage.  She does not have a regular eye physician.  Intolerant of many medications.  Says all she can take is Levaquin and Keflex.  Takes Nature-Throid as she says Synthroid causes severe allergic reaction.  No prior history of eye injury or surgery.  Had sinus surgery in 2003 along with tonsillectomy.  C-section 2009.  History of osteopenia.  History of anticardiolipin antibody.   Review of Systems denies fever chills headache nausea or vomiting.     Objective:   Physical Exam Seen virtually in no acute distress.  Difficult to evaluate right eye on virtual visit but extraocular movements appear to be full.  There seems to be some swelling of tissue near tear duct right medial.       Assessment & Plan:  ?  Eye injury from flying bark while doing yard work some 3 months ago  ?  Conjunctivitis  ?  Blocked tear duct  Plan: Recommend warm hot compresses 20 minutes 2-3 times a day.  She is unable to tolerate some antibiotics.  Prefers Levaquin.  Prescribe Levaquin 500 mg daily for 10 days.  Terazol 7 vaginal cream prescribed should she develop Candida vaginitis while on antibiotics.  Also prescribed ofloxacin ophthalmic drops 2 drops in right eye 4 times a day for 5 to 7 days.   If symptoms not improving in a few days we will need to refer to ophthalmologist.

## 2020-08-03 DIAGNOSIS — M9903 Segmental and somatic dysfunction of lumbar region: Secondary | ICD-10-CM | POA: Diagnosis not present

## 2020-08-03 DIAGNOSIS — M9904 Segmental and somatic dysfunction of sacral region: Secondary | ICD-10-CM | POA: Diagnosis not present

## 2020-08-03 DIAGNOSIS — M9902 Segmental and somatic dysfunction of thoracic region: Secondary | ICD-10-CM | POA: Diagnosis not present

## 2020-08-03 DIAGNOSIS — M9901 Segmental and somatic dysfunction of cervical region: Secondary | ICD-10-CM | POA: Diagnosis not present

## 2020-08-07 DIAGNOSIS — M9902 Segmental and somatic dysfunction of thoracic region: Secondary | ICD-10-CM | POA: Diagnosis not present

## 2020-08-07 DIAGNOSIS — M9904 Segmental and somatic dysfunction of sacral region: Secondary | ICD-10-CM | POA: Diagnosis not present

## 2020-08-07 DIAGNOSIS — M9903 Segmental and somatic dysfunction of lumbar region: Secondary | ICD-10-CM | POA: Diagnosis not present

## 2020-08-07 DIAGNOSIS — M9901 Segmental and somatic dysfunction of cervical region: Secondary | ICD-10-CM | POA: Diagnosis not present

## 2020-08-11 DIAGNOSIS — M9904 Segmental and somatic dysfunction of sacral region: Secondary | ICD-10-CM | POA: Diagnosis not present

## 2020-08-11 DIAGNOSIS — M9902 Segmental and somatic dysfunction of thoracic region: Secondary | ICD-10-CM | POA: Diagnosis not present

## 2020-08-11 DIAGNOSIS — M9903 Segmental and somatic dysfunction of lumbar region: Secondary | ICD-10-CM | POA: Diagnosis not present

## 2020-08-11 DIAGNOSIS — M9901 Segmental and somatic dysfunction of cervical region: Secondary | ICD-10-CM | POA: Diagnosis not present

## 2020-08-14 DIAGNOSIS — M9903 Segmental and somatic dysfunction of lumbar region: Secondary | ICD-10-CM | POA: Diagnosis not present

## 2020-08-14 DIAGNOSIS — M9901 Segmental and somatic dysfunction of cervical region: Secondary | ICD-10-CM | POA: Diagnosis not present

## 2020-08-14 DIAGNOSIS — M9902 Segmental and somatic dysfunction of thoracic region: Secondary | ICD-10-CM | POA: Diagnosis not present

## 2020-08-14 DIAGNOSIS — M9904 Segmental and somatic dysfunction of sacral region: Secondary | ICD-10-CM | POA: Diagnosis not present

## 2020-08-22 DIAGNOSIS — M9903 Segmental and somatic dysfunction of lumbar region: Secondary | ICD-10-CM | POA: Diagnosis not present

## 2020-08-22 DIAGNOSIS — M9904 Segmental and somatic dysfunction of sacral region: Secondary | ICD-10-CM | POA: Diagnosis not present

## 2020-08-22 DIAGNOSIS — M9901 Segmental and somatic dysfunction of cervical region: Secondary | ICD-10-CM | POA: Diagnosis not present

## 2020-08-22 DIAGNOSIS — M9902 Segmental and somatic dysfunction of thoracic region: Secondary | ICD-10-CM | POA: Diagnosis not present

## 2020-08-24 DIAGNOSIS — M9903 Segmental and somatic dysfunction of lumbar region: Secondary | ICD-10-CM | POA: Diagnosis not present

## 2020-08-24 DIAGNOSIS — M9901 Segmental and somatic dysfunction of cervical region: Secondary | ICD-10-CM | POA: Diagnosis not present

## 2020-08-24 DIAGNOSIS — M9904 Segmental and somatic dysfunction of sacral region: Secondary | ICD-10-CM | POA: Diagnosis not present

## 2020-08-24 DIAGNOSIS — M9902 Segmental and somatic dysfunction of thoracic region: Secondary | ICD-10-CM | POA: Diagnosis not present

## 2020-08-30 ENCOUNTER — Telehealth: Payer: Self-pay | Admitting: Internal Medicine

## 2020-08-30 NOTE — Telephone Encounter (Signed)
After speaking with Dr Lenord Fellers schedule for car visit

## 2020-08-30 NOTE — Telephone Encounter (Signed)
Brittney Cannon [phone]  Heydi called to say she has fever 99.5, fatigues, hips started hurting about an hour ago. She has 1 child with Flu, 1 with COVID, and 1 with pink eye and another one going to doctor that is sick. She stated she is not vaccinated due to her amnune system.

## 2020-08-31 ENCOUNTER — Other Ambulatory Visit: Payer: Self-pay

## 2020-08-31 ENCOUNTER — Ambulatory Visit (INDEPENDENT_AMBULATORY_CARE_PROVIDER_SITE_OTHER): Payer: BC Managed Care – PPO | Admitting: Internal Medicine

## 2020-08-31 VITALS — HR 96 | Temp 98.0°F

## 2020-08-31 DIAGNOSIS — Z20822 Contact with and (suspected) exposure to covid-19: Secondary | ICD-10-CM

## 2020-08-31 DIAGNOSIS — Z20828 Contact with and (suspected) exposure to other viral communicable diseases: Secondary | ICD-10-CM

## 2020-08-31 DIAGNOSIS — R509 Fever, unspecified: Secondary | ICD-10-CM | POA: Diagnosis not present

## 2020-08-31 LAB — POCT INFLUENZA A/B
Influenza A, POC: NEGATIVE
Influenza B, POC: NEGATIVE

## 2020-08-31 LAB — POCT RAPID STREP A (OFFICE): Rapid Strep A Screen: NEGATIVE

## 2020-08-31 MED ORDER — PREDNISONE 10 MG PO TABS: ORAL_TABLET | ORAL | 0 refills | Status: DC

## 2020-08-31 MED ORDER — LEVOFLOXACIN 500 MG PO TABS
500.0000 mg | ORAL_TABLET | Freq: Every day | ORAL | 0 refills | Status: DC
Start: 2020-08-31 — End: 2021-11-16

## 2020-08-31 MED ORDER — FLUCONAZOLE 150 MG PO TABS
150.0000 mg | ORAL_TABLET | Freq: Once | ORAL | 0 refills | Status: AC
Start: 2020-08-31 — End: 2020-08-31

## 2020-08-31 NOTE — Progress Notes (Addendum)
   Subjective:    Patient ID: Brittney Cannon, female    DOB: 1975/12/24, 45 y.o.   MRN: [id number]  HPI 45 year old Female called with complaint of low-grade fever of 99.5 degrees fatigue and hip pain.  She has a child with the flu and one with Covid.  Patient has not been vaccinated for COVID-19.  Patient first presented to this office in 2017 with history of hypothyroidism.  She cannot take Synthroid says she has severe allergic reaction.Takes Nature-throid through integrative medicine physicians in [place].  Is allergic to penicillin.  Erythromycin causes vomiting and a rash.  Codeine causes vomiting and a rash.  Doxycycline causes nausea and vomiting.  Biaxin causes adverse reaction.  Zithromax causes abdominal cramping.  Patient says she can take Levaquin and Keflex.  History of osteopenia.  Apparently took Forteo injections a number of years ago.  History of anticardiolipin antibody.  History of tonsillectomy and sinus surgery in 2003.  C-section in 2009.  Dr. Billy Coast is GYN physician.  Due to multiple illness exposure patient was tested by rapid strep screen for strep throat and result was negative.  She was tested for influenza a and B with rapid flu test and result was negative.  COVID-19 PCR test obtained and result is pending.      Review of Systems see above.  Does not appear to be tachypneic.     Objective:   Physical Exam Temperature 98 degrees pulse 96 regular pulse oximetry 97%  Pharynx is slightly injected without exudate.  TMs are clear.  Chest clear to auscultation.       Assessment & Plan:  Exposure to multiple respiratory pathogens.  Rapid strep test and rapid flu test are negative.  COVID-19 testing via PCR is pending.  Plan: Quarantine until COVID-19 test result is back.  Due to her symptoms she would like to be placed on antibiotics.  Have prescribed Levaquin 500 mg number 7 tablets 1 p.o. daily.  She also wants to take prednisone in tapering course as  directed which we have prescribed previously.  She was prescribed prednisone 10 mg tablets (#21) and will take 6 tablets day 1 and decrease by 10 mg daily i.e. 6-5-4-3-2-1 taper.  Time spent with patient including reviewing records, examining patient and E scribing for medications is 20 minutes.

## 2020-09-02 ENCOUNTER — Encounter: Payer: Self-pay | Admitting: Internal Medicine

## 2020-09-02 NOTE — Patient Instructions (Signed)
Take Levaquin 500 mg daily for 7 days.  Take prednisone 10 mg tablets in tapering course starting with 6 tablets day 1 and decreasing by 10 mg daily i.e. 6-5-4-3-2-1 taper.  Quarantine until COVID-19 test results are back.  Rapid flu test and rapid strep screen are both negative.

## 2020-09-03 ENCOUNTER — Encounter: Payer: Self-pay | Admitting: Internal Medicine

## 2020-09-03 ENCOUNTER — Telehealth (INDEPENDENT_AMBULATORY_CARE_PROVIDER_SITE_OTHER): Payer: BC Managed Care – PPO | Admitting: Internal Medicine

## 2020-09-03 DIAGNOSIS — Z28310 Unvaccinated for covid-19: Secondary | ICD-10-CM

## 2020-09-03 DIAGNOSIS — Z2821 Immunization not carried out because of patient refusal: Secondary | ICD-10-CM

## 2020-09-03 DIAGNOSIS — U071 COVID-19: Secondary | ICD-10-CM | POA: Diagnosis not present

## 2020-09-03 LAB — SARS-COV-2 RNA,(COVID-19) QUALITATIVE NAAT: SARS CoV2 RNA: DETECTED — AB

## 2020-09-03 NOTE — Telephone Encounter (Signed)
Patient has positive Covid-19 test result from her visit on January 6th. I called her to see how she is doing. She had already seen Covid-19 test results in Epic on MyChart..   She has just started antibiotics and steroids today. Sounds nasally congested. Is not short of breath. Felt achy the day she was seen here like she could have the flu.Rapid flu test and rapid strep screen  were both negative here at the office on January 6th. She has no nausea or vomiting. Has been fatigued. Sore throat which was a main complaint at time of OV on  January 6th has improved. No significant cough.  History of Covid-14 September 2019. She has not been vaccinated for Covid-19 and has declined vaccine.  Patient is at home and I am in my office. She is identified using 2 identifiers as Brittney Cannon, a patient in this practice and is agreeable to speaking with me today.  Time spent reviewing test results, medical decision making and speaking with patient on the phone today is 10 minutes.

## 2020-09-03 NOTE — Addendum Note (Signed)
Addended by: Margaree Mackintosh on: 09/03/2020 05:34 PM   Modules accepted: Level of Service

## 2020-09-04 ENCOUNTER — Telehealth: Payer: Self-pay | Admitting: Internal Medicine

## 2020-09-04 NOTE — Telephone Encounter (Signed)
Faxed Positive COVID-19 lab results to [facility] [phone], phone number [phone]

## 2020-10-05 DIAGNOSIS — E559 Vitamin D deficiency, unspecified: Secondary | ICD-10-CM | POA: Diagnosis not present

## 2020-10-05 DIAGNOSIS — D809 Immunodeficiency with predominantly antibody defects, unspecified: Secondary | ICD-10-CM | POA: Diagnosis not present

## 2020-10-05 DIAGNOSIS — E063 Autoimmune thyroiditis: Secondary | ICD-10-CM | POA: Diagnosis not present

## 2020-10-05 DIAGNOSIS — R5383 Other fatigue: Secondary | ICD-10-CM | POA: Diagnosis not present

## 2020-10-12 DIAGNOSIS — E039 Hypothyroidism, unspecified: Secondary | ICD-10-CM | POA: Diagnosis not present

## 2020-10-12 DIAGNOSIS — R5383 Other fatigue: Secondary | ICD-10-CM | POA: Diagnosis not present

## 2020-10-12 DIAGNOSIS — E559 Vitamin D deficiency, unspecified: Secondary | ICD-10-CM | POA: Diagnosis not present

## 2020-10-12 DIAGNOSIS — N943 Premenstrual tension syndrome: Secondary | ICD-10-CM | POA: Diagnosis not present

## 2020-10-12 DIAGNOSIS — B2790 Infectious mononucleosis, unspecified without complication: Secondary | ICD-10-CM | POA: Diagnosis not present

## 2020-10-30 DIAGNOSIS — R5383 Other fatigue: Secondary | ICD-10-CM | POA: Diagnosis not present

## 2020-10-30 DIAGNOSIS — Z13220 Encounter for screening for lipoid disorders: Secondary | ICD-10-CM | POA: Diagnosis not present

## 2020-12-04 DIAGNOSIS — M9901 Segmental and somatic dysfunction of cervical region: Secondary | ICD-10-CM | POA: Diagnosis not present

## 2020-12-04 DIAGNOSIS — M9902 Segmental and somatic dysfunction of thoracic region: Secondary | ICD-10-CM | POA: Diagnosis not present

## 2020-12-04 DIAGNOSIS — M9903 Segmental and somatic dysfunction of lumbar region: Secondary | ICD-10-CM | POA: Diagnosis not present

## 2020-12-04 DIAGNOSIS — M9904 Segmental and somatic dysfunction of sacral region: Secondary | ICD-10-CM | POA: Diagnosis not present

## 2020-12-19 DIAGNOSIS — M9902 Segmental and somatic dysfunction of thoracic region: Secondary | ICD-10-CM | POA: Diagnosis not present

## 2020-12-19 DIAGNOSIS — M9901 Segmental and somatic dysfunction of cervical region: Secondary | ICD-10-CM | POA: Diagnosis not present

## 2020-12-19 DIAGNOSIS — M9904 Segmental and somatic dysfunction of sacral region: Secondary | ICD-10-CM | POA: Diagnosis not present

## 2020-12-19 DIAGNOSIS — M9903 Segmental and somatic dysfunction of lumbar region: Secondary | ICD-10-CM | POA: Diagnosis not present

## 2020-12-25 DIAGNOSIS — Z3149 Encounter for other procreative investigation and testing: Secondary | ICD-10-CM | POA: Diagnosis not present

## 2020-12-27 DIAGNOSIS — Z3149 Encounter for other procreative investigation and testing: Secondary | ICD-10-CM | POA: Diagnosis not present

## 2021-01-11 DIAGNOSIS — Z113 Encounter for screening for infections with a predominantly sexual mode of transmission: Secondary | ICD-10-CM | POA: Diagnosis not present

## 2021-01-11 DIAGNOSIS — E288 Other ovarian dysfunction: Secondary | ICD-10-CM | POA: Diagnosis not present

## 2021-01-11 DIAGNOSIS — Z3141 Encounter for fertility testing: Secondary | ICD-10-CM | POA: Diagnosis not present

## 2021-01-24 ENCOUNTER — Telehealth: Payer: Self-pay | Admitting: Internal Medicine

## 2021-01-24 MED ORDER — SCOPOLAMINE 1 MG/3DAYS TD PT72: 1.0000 | MEDICATED_PATCH | TRANSDERMAL | 12 refills | Status: DC

## 2021-01-24 NOTE — Telephone Encounter (Signed)
Transderm scop sent to [facility]

## 2021-01-24 NOTE — Telephone Encounter (Signed)
Charlann Noss [phone]  [name] called to see if you would call in some patches for nausea, she is leaving tomorrow to go [employer]. She said you does this at least once a year for her..  She would like it sent to [facility] [phone]

## 2021-01-24 NOTE — Telephone Encounter (Signed)
Called patient

## 2021-01-24 NOTE — Telephone Encounter (Signed)
Have sent this in just now.

## 2021-04-11 DIAGNOSIS — M9902 Segmental and somatic dysfunction of thoracic region: Secondary | ICD-10-CM | POA: Diagnosis not present

## 2021-04-11 DIAGNOSIS — M9903 Segmental and somatic dysfunction of lumbar region: Secondary | ICD-10-CM | POA: Diagnosis not present

## 2021-04-11 DIAGNOSIS — M9904 Segmental and somatic dysfunction of sacral region: Secondary | ICD-10-CM | POA: Diagnosis not present

## 2021-04-11 DIAGNOSIS — M9901 Segmental and somatic dysfunction of cervical region: Secondary | ICD-10-CM | POA: Diagnosis not present

## 2021-04-13 DIAGNOSIS — M9903 Segmental and somatic dysfunction of lumbar region: Secondary | ICD-10-CM | POA: Diagnosis not present

## 2021-04-13 DIAGNOSIS — M9902 Segmental and somatic dysfunction of thoracic region: Secondary | ICD-10-CM | POA: Diagnosis not present

## 2021-04-13 DIAGNOSIS — M9901 Segmental and somatic dysfunction of cervical region: Secondary | ICD-10-CM | POA: Diagnosis not present

## 2021-04-13 DIAGNOSIS — M9904 Segmental and somatic dysfunction of sacral region: Secondary | ICD-10-CM | POA: Diagnosis not present

## 2021-04-16 DIAGNOSIS — M9901 Segmental and somatic dysfunction of cervical region: Secondary | ICD-10-CM | POA: Diagnosis not present

## 2021-04-16 DIAGNOSIS — M9902 Segmental and somatic dysfunction of thoracic region: Secondary | ICD-10-CM | POA: Diagnosis not present

## 2021-04-16 DIAGNOSIS — M9904 Segmental and somatic dysfunction of sacral region: Secondary | ICD-10-CM | POA: Diagnosis not present

## 2021-04-16 DIAGNOSIS — M9903 Segmental and somatic dysfunction of lumbar region: Secondary | ICD-10-CM | POA: Diagnosis not present

## 2021-04-18 DIAGNOSIS — R5383 Other fatigue: Secondary | ICD-10-CM | POA: Diagnosis not present

## 2021-04-18 DIAGNOSIS — N943 Premenstrual tension syndrome: Secondary | ICD-10-CM | POA: Diagnosis not present

## 2021-04-18 DIAGNOSIS — Z131 Encounter for screening for diabetes mellitus: Secondary | ICD-10-CM | POA: Diagnosis not present

## 2021-04-18 DIAGNOSIS — E039 Hypothyroidism, unspecified: Secondary | ICD-10-CM | POA: Diagnosis not present

## 2021-04-18 DIAGNOSIS — E559 Vitamin D deficiency, unspecified: Secondary | ICD-10-CM | POA: Diagnosis not present

## 2021-04-18 DIAGNOSIS — Z13220 Encounter for screening for lipoid disorders: Secondary | ICD-10-CM | POA: Diagnosis not present

## 2021-04-27 DIAGNOSIS — R5383 Other fatigue: Secondary | ICD-10-CM | POA: Diagnosis not present

## 2021-04-27 DIAGNOSIS — E7212 Methylenetetrahydrofolate reductase deficiency: Secondary | ICD-10-CM | POA: Diagnosis not present

## 2021-04-27 DIAGNOSIS — K589 Irritable bowel syndrome without diarrhea: Secondary | ICD-10-CM | POA: Diagnosis not present

## 2021-04-27 DIAGNOSIS — E039 Hypothyroidism, unspecified: Secondary | ICD-10-CM | POA: Diagnosis not present

## 2021-05-01 DIAGNOSIS — M9903 Segmental and somatic dysfunction of lumbar region: Secondary | ICD-10-CM | POA: Diagnosis not present

## 2021-05-01 DIAGNOSIS — M9902 Segmental and somatic dysfunction of thoracic region: Secondary | ICD-10-CM | POA: Diagnosis not present

## 2021-05-01 DIAGNOSIS — M9904 Segmental and somatic dysfunction of sacral region: Secondary | ICD-10-CM | POA: Diagnosis not present

## 2021-05-01 DIAGNOSIS — M9901 Segmental and somatic dysfunction of cervical region: Secondary | ICD-10-CM | POA: Diagnosis not present

## 2021-05-16 DIAGNOSIS — L309 Dermatitis, unspecified: Secondary | ICD-10-CM | POA: Diagnosis not present

## 2021-05-29 ENCOUNTER — Telehealth: Payer: Self-pay | Admitting: Internal Medicine

## 2021-05-29 NOTE — Telephone Encounter (Signed)
Called patient to let her know she need to schedule an appointment with ortho.

## 2021-05-29 NOTE — Telephone Encounter (Signed)
Brittney Cannon [phone]  [name] called to say Friday she fell and caught herself with right wrist, it is still hurting, not swollen or bruised.. I let her know she might want to got to Ortho so they can get xray and treat. She wants to know what you think?

## 2021-05-31 DIAGNOSIS — M7711 Lateral epicondylitis, right elbow: Secondary | ICD-10-CM | POA: Diagnosis not present

## 2021-05-31 DIAGNOSIS — M9903 Segmental and somatic dysfunction of lumbar region: Secondary | ICD-10-CM | POA: Diagnosis not present

## 2021-05-31 DIAGNOSIS — M25521 Pain in right elbow: Secondary | ICD-10-CM | POA: Diagnosis not present

## 2021-05-31 DIAGNOSIS — M9902 Segmental and somatic dysfunction of thoracic region: Secondary | ICD-10-CM | POA: Diagnosis not present

## 2021-05-31 DIAGNOSIS — M7918 Myalgia, other site: Secondary | ICD-10-CM | POA: Diagnosis not present

## 2021-05-31 DIAGNOSIS — M9904 Segmental and somatic dysfunction of sacral region: Secondary | ICD-10-CM | POA: Diagnosis not present

## 2021-05-31 DIAGNOSIS — M9901 Segmental and somatic dysfunction of cervical region: Secondary | ICD-10-CM | POA: Diagnosis not present

## 2021-06-04 DIAGNOSIS — M9904 Segmental and somatic dysfunction of sacral region: Secondary | ICD-10-CM | POA: Diagnosis not present

## 2021-06-04 DIAGNOSIS — M9902 Segmental and somatic dysfunction of thoracic region: Secondary | ICD-10-CM | POA: Diagnosis not present

## 2021-06-04 DIAGNOSIS — M9903 Segmental and somatic dysfunction of lumbar region: Secondary | ICD-10-CM | POA: Diagnosis not present

## 2021-06-04 DIAGNOSIS — M9901 Segmental and somatic dysfunction of cervical region: Secondary | ICD-10-CM | POA: Diagnosis not present

## 2021-06-28 DIAGNOSIS — Z23 Encounter for immunization: Secondary | ICD-10-CM | POA: Diagnosis not present

## 2021-07-06 ENCOUNTER — Encounter: Payer: Self-pay | Admitting: Internal Medicine

## 2021-07-30 DIAGNOSIS — M25521 Pain in right elbow: Secondary | ICD-10-CM | POA: Diagnosis not present

## 2021-07-30 DIAGNOSIS — M79644 Pain in right finger(s): Secondary | ICD-10-CM | POA: Diagnosis not present

## 2021-08-07 DIAGNOSIS — M25521 Pain in right elbow: Secondary | ICD-10-CM | POA: Diagnosis not present

## 2021-08-07 DIAGNOSIS — M7711 Lateral epicondylitis, right elbow: Secondary | ICD-10-CM | POA: Diagnosis not present

## 2021-10-03 ENCOUNTER — Encounter: Payer: Self-pay | Admitting: Internal Medicine

## 2021-10-04 DIAGNOSIS — M9905 Segmental and somatic dysfunction of pelvic region: Secondary | ICD-10-CM | POA: Diagnosis not present

## 2021-10-04 DIAGNOSIS — M9902 Segmental and somatic dysfunction of thoracic region: Secondary | ICD-10-CM | POA: Diagnosis not present

## 2021-10-04 DIAGNOSIS — M9904 Segmental and somatic dysfunction of sacral region: Secondary | ICD-10-CM | POA: Diagnosis not present

## 2021-10-04 DIAGNOSIS — M9903 Segmental and somatic dysfunction of lumbar region: Secondary | ICD-10-CM | POA: Diagnosis not present

## 2021-10-08 DIAGNOSIS — M9905 Segmental and somatic dysfunction of pelvic region: Secondary | ICD-10-CM | POA: Diagnosis not present

## 2021-10-08 DIAGNOSIS — M9904 Segmental and somatic dysfunction of sacral region: Secondary | ICD-10-CM | POA: Diagnosis not present

## 2021-10-08 DIAGNOSIS — M9903 Segmental and somatic dysfunction of lumbar region: Secondary | ICD-10-CM | POA: Diagnosis not present

## 2021-10-08 DIAGNOSIS — M9902 Segmental and somatic dysfunction of thoracic region: Secondary | ICD-10-CM | POA: Diagnosis not present

## 2021-11-13 ENCOUNTER — Telehealth: Payer: Self-pay | Admitting: Internal Medicine

## 2021-11-13 NOTE — Telephone Encounter (Signed)
Brittney Cannon ?[phone] ? ?[name] called to say she needs an EKG for adopting an Embro. She is trying to get everything she needs before May. She stated there is not any paperwork that needs to be filled out just a copy of current EKG.  ?

## 2021-11-13 NOTE — Telephone Encounter (Signed)
After speaking with DR Renold Genta schedule an appointment. ?

## 2021-11-16 ENCOUNTER — Ambulatory Visit (INDEPENDENT_AMBULATORY_CARE_PROVIDER_SITE_OTHER): Payer: BC Managed Care – PPO | Admitting: Internal Medicine

## 2021-11-16 ENCOUNTER — Encounter: Payer: Self-pay | Admitting: Internal Medicine

## 2021-11-16 ENCOUNTER — Other Ambulatory Visit: Payer: Self-pay

## 2021-11-16 VITALS — BP 122/78 | HR 100 | Temp 99.0°F | Ht 65.5 in | Wt 153.5 lb

## 2021-11-16 DIAGNOSIS — Z01811 Encounter for preprocedural respiratory examination: Secondary | ICD-10-CM | POA: Diagnosis not present

## 2021-11-16 NOTE — Progress Notes (Signed)
? ?  Subjective:  ? ? Patient ID: Brittney Cannon, female    DOB: August 21, 1976, 46 y.o.   MRN: [id number] ? ?HPI 46 year old Female here for EKG and brief exam for pre-pregnancy counseling. EKG obtained today is entirely normal. She has obtained 2 embryos and is planning to carry one to term. Needs echocardiogram in addition to EKG apparently. ? ?Her general health is excellent. Married. Has 2 sons. ?Takes thyroid replacement medication. She is self-employed staging homes for real estate market. Non-smoker. Does not consume alcohol. ? ? ? ?Review of Systems see above -feels well no new complaints ? ?   ?Objective:  ? Physical Exam ? ?BP 122/78 pulse 100 T 99 degrees pulse ox 98% weight 153 pounds.  Skin warm and dry.  Nodes none.  Neck is supple without thyromegaly or adenopathy.  Chest clear to auscultation.  Cardiac exam: Regular rate and rhythm without murmur or ectopy.  No lower extremity pitting edema.  Affect thought and judgment are normal. ? ? ?   ?Assessment & Plan:  ? ?Pre-pregnancy evaluation. Is obtaining embryo from [facility]. Needs EKG which was done today and is normal. 2D Echo ordered at patient request. She says it is required by agency. ?

## 2021-11-20 ENCOUNTER — Encounter: Payer: Self-pay | Admitting: Internal Medicine

## 2021-11-20 ENCOUNTER — Other Ambulatory Visit: Payer: Self-pay

## 2021-11-20 DIAGNOSIS — Z01818 Encounter for other preprocedural examination: Secondary | ICD-10-CM

## 2021-12-05 DIAGNOSIS — N979 Female infertility, unspecified: Secondary | ICD-10-CM | POA: Diagnosis not present

## 2021-12-19 DIAGNOSIS — Z0289 Encounter for other administrative examinations: Secondary | ICD-10-CM

## 2021-12-25 DIAGNOSIS — M9902 Segmental and somatic dysfunction of thoracic region: Secondary | ICD-10-CM | POA: Diagnosis not present

## 2021-12-25 DIAGNOSIS — M9903 Segmental and somatic dysfunction of lumbar region: Secondary | ICD-10-CM | POA: Diagnosis not present

## 2021-12-25 DIAGNOSIS — M9904 Segmental and somatic dysfunction of sacral region: Secondary | ICD-10-CM | POA: Diagnosis not present

## 2021-12-25 DIAGNOSIS — M9905 Segmental and somatic dysfunction of pelvic region: Secondary | ICD-10-CM | POA: Diagnosis not present

## 2022-01-04 DIAGNOSIS — R5383 Other fatigue: Secondary | ICD-10-CM | POA: Diagnosis not present

## 2022-01-04 DIAGNOSIS — Z13220 Encounter for screening for lipoid disorders: Secondary | ICD-10-CM | POA: Diagnosis not present

## 2022-01-04 DIAGNOSIS — E559 Vitamin D deficiency, unspecified: Secondary | ICD-10-CM | POA: Diagnosis not present

## 2022-01-04 DIAGNOSIS — K589 Irritable bowel syndrome without diarrhea: Secondary | ICD-10-CM | POA: Diagnosis not present

## 2022-01-04 DIAGNOSIS — E039 Hypothyroidism, unspecified: Secondary | ICD-10-CM | POA: Diagnosis not present

## 2022-01-04 DIAGNOSIS — N943 Premenstrual tension syndrome: Secondary | ICD-10-CM | POA: Diagnosis not present

## 2022-01-04 DIAGNOSIS — Z131 Encounter for screening for diabetes mellitus: Secondary | ICD-10-CM | POA: Diagnosis not present

## 2022-01-28 ENCOUNTER — Other Ambulatory Visit: Payer: Self-pay | Admitting: Internal Medicine

## 2022-01-28 ENCOUNTER — Encounter: Payer: Self-pay | Admitting: Internal Medicine

## 2022-01-31 DIAGNOSIS — B2790 Infectious mononucleosis, unspecified without complication: Secondary | ICD-10-CM | POA: Diagnosis not present

## 2022-01-31 DIAGNOSIS — R5383 Other fatigue: Secondary | ICD-10-CM | POA: Diagnosis not present

## 2022-01-31 DIAGNOSIS — E063 Autoimmune thyroiditis: Secondary | ICD-10-CM | POA: Diagnosis not present

## 2022-01-31 DIAGNOSIS — E039 Hypothyroidism, unspecified: Secondary | ICD-10-CM | POA: Diagnosis not present

## 2022-01-31 DIAGNOSIS — Z77120 Contact with and (suspected) exposure to mold (toxic): Secondary | ICD-10-CM | POA: Diagnosis not present

## 2022-01-31 DIAGNOSIS — E559 Vitamin D deficiency, unspecified: Secondary | ICD-10-CM | POA: Diagnosis not present

## 2022-02-01 ENCOUNTER — Other Ambulatory Visit: Payer: Self-pay

## 2022-02-01 MED ORDER — ALPRAZOLAM 0.5 MG PO TABS: ORAL_TABLET | ORAL | 0 refills | Status: DC

## 2022-02-01 MED ORDER — SCOPOLAMINE 1 MG/3DAYS TD PT72: 1.0000 | MEDICATED_PATCH | TRANSDERMAL | 12 refills | Status: DC

## 2022-02-01 NOTE — Telephone Encounter (Signed)
Sent in

## 2022-02-05 DIAGNOSIS — M9904 Segmental and somatic dysfunction of sacral region: Secondary | ICD-10-CM | POA: Diagnosis not present

## 2022-02-05 DIAGNOSIS — M9902 Segmental and somatic dysfunction of thoracic region: Secondary | ICD-10-CM | POA: Diagnosis not present

## 2022-02-05 DIAGNOSIS — M9903 Segmental and somatic dysfunction of lumbar region: Secondary | ICD-10-CM | POA: Diagnosis not present

## 2022-02-05 DIAGNOSIS — M9905 Segmental and somatic dysfunction of pelvic region: Secondary | ICD-10-CM | POA: Diagnosis not present

## 2022-02-07 DIAGNOSIS — M9902 Segmental and somatic dysfunction of thoracic region: Secondary | ICD-10-CM | POA: Diagnosis not present

## 2022-02-07 DIAGNOSIS — M9903 Segmental and somatic dysfunction of lumbar region: Secondary | ICD-10-CM | POA: Diagnosis not present

## 2022-02-07 DIAGNOSIS — M9905 Segmental and somatic dysfunction of pelvic region: Secondary | ICD-10-CM | POA: Diagnosis not present

## 2022-02-07 DIAGNOSIS — M9904 Segmental and somatic dysfunction of sacral region: Secondary | ICD-10-CM | POA: Diagnosis not present

## 2022-05-21 ENCOUNTER — Encounter: Payer: BC Managed Care – PPO | Admitting: Internal Medicine

## 2022-05-24 ENCOUNTER — Encounter: Payer: Self-pay | Admitting: Internal Medicine

## 2022-05-25 ENCOUNTER — Telehealth (INDEPENDENT_AMBULATORY_CARE_PROVIDER_SITE_OTHER): Payer: BC Managed Care – PPO | Admitting: Internal Medicine

## 2022-05-25 ENCOUNTER — Encounter: Payer: Self-pay | Admitting: Internal Medicine

## 2022-05-25 DIAGNOSIS — R197 Diarrhea, unspecified: Secondary | ICD-10-CM | POA: Diagnosis not present

## 2022-05-25 DIAGNOSIS — R1110 Vomiting, unspecified: Secondary | ICD-10-CM | POA: Diagnosis not present

## 2022-05-25 MED ORDER — PROMETHAZINE HCL 25 MG PO TABS: 25.0000 mg | ORAL_TABLET | Freq: Three times a day (TID) | ORAL | 0 refills | Status: DC | PRN

## 2022-05-25 NOTE — Telephone Encounter (Addendum)
I connected with Lovette Cliche today by telephone as she had sent a MyChart message regarding vomiting and diarrhea onset last evening.  She is identified using 2 identifiers as Edia Pursifull. Muller, a patient in this practice.  She is at her home and I am at my office.  She is agreeable to visit in this format today.  Patient's father came to visit and came down with gastroenteritis symptoms.  Patient started with gastroenteritis symptoms last evening and vomited multiple times and had multiple bouts of diarrhea.  She found one Phenergan 25 mg tablet at home and took that and got a little bit of relief but is concerned about ongoing symptoms.  She has no blood in vomitus or blood in her stool.  Temperature is around 99.5 degrees.  Several family members are healed.  She does not feel that this is a bacterial infection.  She feels that this is probably a viral gastroenteritis.  Counseled patient regarding clear liquids such as 7-Up, ginger ale, Coke, Sprite instead of plain water which tends to cause nausea.  Needs to stay with clear liquids until diarrhea has resolved.  Could eat some saltine crackers if very hungry.  Call back if patient develops blood in stool or black tarry bowel movements.  Would expect symptoms to get better in 24 to 48 hours.  Advised against taking Lomotil for Imodium at this time.  It is better to let gastroenteritis run its course.  Call in Phenergan 25 mg tablets (#30) 1 tablet every 4-6 hours as needed for nausea.  Stay with clear liquids.  Avoid milk products.  Call if not improving in 24 to 48 hours or sooner if worse.   Time spent with chart review, interviewing patient on phone,  medical decision making  and e-scribing medication is 10 minutes.

## 2022-06-18 DIAGNOSIS — Z79899 Other long term (current) drug therapy: Secondary | ICD-10-CM | POA: Diagnosis not present

## 2022-06-18 DIAGNOSIS — E039 Hypothyroidism, unspecified: Secondary | ICD-10-CM | POA: Diagnosis not present

## 2022-06-18 DIAGNOSIS — E559 Vitamin D deficiency, unspecified: Secondary | ICD-10-CM | POA: Diagnosis not present

## 2022-06-18 DIAGNOSIS — R5383 Other fatigue: Secondary | ICD-10-CM | POA: Diagnosis not present

## 2022-08-04 ENCOUNTER — Encounter: Payer: Self-pay | Admitting: Internal Medicine

## 2022-09-04 DIAGNOSIS — M7918 Myalgia, other site: Secondary | ICD-10-CM | POA: Diagnosis not present

## 2022-09-04 DIAGNOSIS — M9903 Segmental and somatic dysfunction of lumbar region: Secondary | ICD-10-CM | POA: Diagnosis not present

## 2022-09-04 DIAGNOSIS — M9902 Segmental and somatic dysfunction of thoracic region: Secondary | ICD-10-CM | POA: Diagnosis not present

## 2022-09-04 DIAGNOSIS — M9905 Segmental and somatic dysfunction of pelvic region: Secondary | ICD-10-CM | POA: Diagnosis not present

## 2022-09-04 DIAGNOSIS — M25659 Stiffness of unspecified hip, not elsewhere classified: Secondary | ICD-10-CM | POA: Diagnosis not present

## 2022-09-04 DIAGNOSIS — M9904 Segmental and somatic dysfunction of sacral region: Secondary | ICD-10-CM | POA: Diagnosis not present

## 2022-09-06 DIAGNOSIS — M7918 Myalgia, other site: Secondary | ICD-10-CM | POA: Diagnosis not present

## 2022-09-06 DIAGNOSIS — M25659 Stiffness of unspecified hip, not elsewhere classified: Secondary | ICD-10-CM | POA: Diagnosis not present

## 2022-09-06 DIAGNOSIS — M9903 Segmental and somatic dysfunction of lumbar region: Secondary | ICD-10-CM | POA: Diagnosis not present

## 2022-09-06 DIAGNOSIS — M9902 Segmental and somatic dysfunction of thoracic region: Secondary | ICD-10-CM | POA: Diagnosis not present

## 2022-09-06 DIAGNOSIS — M9904 Segmental and somatic dysfunction of sacral region: Secondary | ICD-10-CM | POA: Diagnosis not present

## 2022-09-06 DIAGNOSIS — M9905 Segmental and somatic dysfunction of pelvic region: Secondary | ICD-10-CM | POA: Diagnosis not present

## 2022-09-10 DIAGNOSIS — M25659 Stiffness of unspecified hip, not elsewhere classified: Secondary | ICD-10-CM | POA: Diagnosis not present

## 2022-09-10 DIAGNOSIS — M7918 Myalgia, other site: Secondary | ICD-10-CM | POA: Diagnosis not present

## 2022-09-10 DIAGNOSIS — M9905 Segmental and somatic dysfunction of pelvic region: Secondary | ICD-10-CM | POA: Diagnosis not present

## 2022-09-10 DIAGNOSIS — M9902 Segmental and somatic dysfunction of thoracic region: Secondary | ICD-10-CM | POA: Diagnosis not present

## 2022-09-10 DIAGNOSIS — M9903 Segmental and somatic dysfunction of lumbar region: Secondary | ICD-10-CM | POA: Diagnosis not present

## 2022-09-10 DIAGNOSIS — M9904 Segmental and somatic dysfunction of sacral region: Secondary | ICD-10-CM | POA: Diagnosis not present

## 2022-09-13 DIAGNOSIS — M25659 Stiffness of unspecified hip, not elsewhere classified: Secondary | ICD-10-CM | POA: Diagnosis not present

## 2022-09-13 DIAGNOSIS — M9904 Segmental and somatic dysfunction of sacral region: Secondary | ICD-10-CM | POA: Diagnosis not present

## 2022-09-13 DIAGNOSIS — M7918 Myalgia, other site: Secondary | ICD-10-CM | POA: Diagnosis not present

## 2022-09-13 DIAGNOSIS — M9905 Segmental and somatic dysfunction of pelvic region: Secondary | ICD-10-CM | POA: Diagnosis not present

## 2022-09-13 DIAGNOSIS — M9903 Segmental and somatic dysfunction of lumbar region: Secondary | ICD-10-CM | POA: Diagnosis not present

## 2022-09-13 DIAGNOSIS — M9902 Segmental and somatic dysfunction of thoracic region: Secondary | ICD-10-CM | POA: Diagnosis not present

## 2022-09-19 DIAGNOSIS — M9902 Segmental and somatic dysfunction of thoracic region: Secondary | ICD-10-CM | POA: Diagnosis not present

## 2022-09-19 DIAGNOSIS — M9905 Segmental and somatic dysfunction of pelvic region: Secondary | ICD-10-CM | POA: Diagnosis not present

## 2022-09-19 DIAGNOSIS — M9904 Segmental and somatic dysfunction of sacral region: Secondary | ICD-10-CM | POA: Diagnosis not present

## 2022-09-19 DIAGNOSIS — M7918 Myalgia, other site: Secondary | ICD-10-CM | POA: Diagnosis not present

## 2022-09-19 DIAGNOSIS — M25659 Stiffness of unspecified hip, not elsewhere classified: Secondary | ICD-10-CM | POA: Diagnosis not present

## 2022-09-19 DIAGNOSIS — M9903 Segmental and somatic dysfunction of lumbar region: Secondary | ICD-10-CM | POA: Diagnosis not present

## 2022-09-23 DIAGNOSIS — M9903 Segmental and somatic dysfunction of lumbar region: Secondary | ICD-10-CM | POA: Diagnosis not present

## 2022-09-23 DIAGNOSIS — M9905 Segmental and somatic dysfunction of pelvic region: Secondary | ICD-10-CM | POA: Diagnosis not present

## 2022-09-23 DIAGNOSIS — M25659 Stiffness of unspecified hip, not elsewhere classified: Secondary | ICD-10-CM | POA: Diagnosis not present

## 2022-09-23 DIAGNOSIS — M7918 Myalgia, other site: Secondary | ICD-10-CM | POA: Diagnosis not present

## 2022-09-23 DIAGNOSIS — M9904 Segmental and somatic dysfunction of sacral region: Secondary | ICD-10-CM | POA: Diagnosis not present

## 2022-09-23 DIAGNOSIS — M9902 Segmental and somatic dysfunction of thoracic region: Secondary | ICD-10-CM | POA: Diagnosis not present

## 2022-09-26 DIAGNOSIS — M9903 Segmental and somatic dysfunction of lumbar region: Secondary | ICD-10-CM | POA: Diagnosis not present

## 2022-09-26 DIAGNOSIS — M7918 Myalgia, other site: Secondary | ICD-10-CM | POA: Diagnosis not present

## 2022-09-26 DIAGNOSIS — M9902 Segmental and somatic dysfunction of thoracic region: Secondary | ICD-10-CM | POA: Diagnosis not present

## 2022-09-26 DIAGNOSIS — M25659 Stiffness of unspecified hip, not elsewhere classified: Secondary | ICD-10-CM | POA: Diagnosis not present

## 2022-09-26 DIAGNOSIS — M9905 Segmental and somatic dysfunction of pelvic region: Secondary | ICD-10-CM | POA: Diagnosis not present

## 2022-09-26 DIAGNOSIS — M9904 Segmental and somatic dysfunction of sacral region: Secondary | ICD-10-CM | POA: Diagnosis not present

## 2022-10-01 DIAGNOSIS — M9905 Segmental and somatic dysfunction of pelvic region: Secondary | ICD-10-CM | POA: Diagnosis not present

## 2022-10-01 DIAGNOSIS — M9903 Segmental and somatic dysfunction of lumbar region: Secondary | ICD-10-CM | POA: Diagnosis not present

## 2022-10-01 DIAGNOSIS — M9902 Segmental and somatic dysfunction of thoracic region: Secondary | ICD-10-CM | POA: Diagnosis not present

## 2022-10-01 DIAGNOSIS — M7918 Myalgia, other site: Secondary | ICD-10-CM | POA: Diagnosis not present

## 2022-10-01 DIAGNOSIS — M9904 Segmental and somatic dysfunction of sacral region: Secondary | ICD-10-CM | POA: Diagnosis not present

## 2022-10-02 DIAGNOSIS — R5383 Other fatigue: Secondary | ICD-10-CM | POA: Diagnosis not present

## 2022-10-02 DIAGNOSIS — E538 Deficiency of other specified B group vitamins: Secondary | ICD-10-CM | POA: Diagnosis not present

## 2022-10-02 DIAGNOSIS — E039 Hypothyroidism, unspecified: Secondary | ICD-10-CM | POA: Diagnosis not present

## 2022-10-02 DIAGNOSIS — B2790 Infectious mononucleosis, unspecified without complication: Secondary | ICD-10-CM | POA: Diagnosis not present

## 2022-10-02 DIAGNOSIS — E782 Mixed hyperlipidemia: Secondary | ICD-10-CM | POA: Diagnosis not present

## 2022-10-02 DIAGNOSIS — N943 Premenstrual tension syndrome: Secondary | ICD-10-CM | POA: Diagnosis not present

## 2022-10-02 DIAGNOSIS — D8989 Other specified disorders involving the immune mechanism, not elsewhere classified: Secondary | ICD-10-CM | POA: Diagnosis not present

## 2022-10-02 DIAGNOSIS — B379 Candidiasis, unspecified: Secondary | ICD-10-CM | POA: Diagnosis not present

## 2022-10-02 DIAGNOSIS — E559 Vitamin D deficiency, unspecified: Secondary | ICD-10-CM | POA: Diagnosis not present

## 2022-10-02 LAB — TSH: TSH: 2.17 (ref 0.41–5.90)

## 2022-10-02 LAB — COMPREHENSIVE METABOLIC PANEL
Albumin: 4.5 (ref 3.5–5.0)
Calcium: 10.1 (ref 8.7–10.7)

## 2022-10-02 LAB — BASIC METABOLIC PANEL
BUN: 13 (ref 4–21)
CO2: 27 — AB (ref 13–22)
Chloride: 107 (ref 99–108)
Creatinine: 0.7 (ref 0.5–1.1)
Glucose: 90
Potassium: 3.6 mEq/L (ref 3.5–5.1)
Sodium: 137 (ref 137–147)

## 2022-10-02 LAB — VITAMIN B12: Vitamin B-12: 299

## 2022-10-02 LAB — LIPID PANEL
Cholesterol: 206 — AB (ref 0–200)
HDL: 77 — AB (ref 35–70)
LDL Cholesterol: 108
Triglycerides: 107 (ref 40–160)

## 2022-10-02 LAB — VITAMIN D 25 HYDROXY (VIT D DEFICIENCY, FRACTURES): Vit D, 25-Hydroxy: 31

## 2022-10-02 LAB — HEPATIC FUNCTION PANEL
ALT: 10 U/L (ref 7–35)
AST: 19 (ref 13–35)
Alkaline Phosphatase: 65 (ref 25–125)
Bilirubin, Total: 1

## 2022-10-02 LAB — CBC AND DIFFERENTIAL
HCT: 40 (ref 36–46)
Hemoglobin: 13.4 (ref 12.0–16.0)
Neutrophils Absolute: 4.1
WBC: 5.8

## 2022-10-02 LAB — TESTOSTERONE: Testosterone: 12

## 2022-10-02 LAB — CBC: RBC: 4.12 (ref 3.87–5.11)

## 2022-10-08 NOTE — Progress Notes (Signed)
Patient Care Team: Elby Showers, MD as PCP - General (Internal Medicine)  Visit Date: 10/08/22  Subjective:    Patient ID: Brittney Cannon , Female   DOB: [birth year], 47 y.o.    MRN: [id number]   47 y.o. Female presents today for annual comprehensive physical exam. Patient has a past medical history of hypothyroidism, IBS, osteopenia.  History of anxiety treated with Xanax 0.5 mg three times daily as needed.  History of attention deficit disorder treated with Vyvanse.  Says she wants to switch back to Adderall.  I informed her that no longer treat attention deficit disorder and recommended [facility].  In the Spring 2023 she was considering carrying an embryo to delivery as she wanted another child.  She has since decided not to pursue this.  Had D and E May 2021 by Dr. Ronita Hipps.  Had Covid-13 September 2020.  She also had COVID-19 in January 2021.  Had tetanus and patient in 2007 and needs update but declines today.  Had flu vaccine last in 2020.  Social history: She is a widow and has a steady female partner.  She has 2 sons.  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  Non-smoker.  Does not consume alcohol.  She is followed at [facility] in [place].  She brings in multiple lab test results today.  Her c-Met was normal, FSH was 19, free T4 and free T3 normal.  Lipid panel was normal.  TSH was 2.17.  CBC was normal with exception of an MCV slightly elevated at 98.  She has been told she has persistent Epstein-Barr virus infection.  She has EBV nuclear antigen antibody IgG which is 314 and positive titer is greater than 21.9.  She has EBV early antigen antibody, IgG at 31.6 which is considered positive.  EBV antibody VCA IgM less than 36.  EBV AB VCA IgG is 330 and considered high  Vitamin B12 level is 299.  History of sinus surgery and tonsillectomy in 2003.  C-section in 2009.  History of osteopenia and  apparently took Forteo a number of years ago.  She is allergic to penicillin.  She says she cannot take Synthroid as she has a severe allergic reaction.  Takes Nature-Throid through integrative medicine physicians in [place].  Diet managed with right leg dural epicondylitis at [facility] in December 2022.  Pap smear last completed 08/26/20. Recommended repeat in 2025.  She is intolerant of many medications including penicillin which causes a rash and breathing issues, doxycycline causes nausea and vomiting, Biaxin causes adverse reaction which patient cannot remember and Zithromax causes abdominal cramping.  Therefore she can only take Levaquin and Keflex.  History of tonsillectomy and sinus surgery 2003 C-section 2009.  Total of 3 pregnancies.  Right second toe and metatarsal fracture June 2016.  Says she has similar event 4 years previously.  Family history: Father with history of hypertension.  Mother with history of osteoporosis, hip fracture arthritis and kidney stones.  1 sister in good health.  2 brothers, 1 of whom is overweight.  Last DEXA scan on file was in 2018 at [facility] and lowest T-score was -2.3  Mammogram is past due.  This was mentioned to her today.  Colonoscopy is past due.  This was mentioned to her today.     Vaccine Counseling: Declines COVID-19 vaccine.  Needs Tdap.  Declines flu vaccine.   Past Medical History:  Diagnosis Date   COVID-19  08/31/2019   cold symptoms, sob, loss of taste and smell, all symptoms resolved with quaranatine   Family history of adverse reaction to anesthesia    mother slow to awaken, takes days   Hypothyroidism    IBS (irritable bowel syndrome)    Osteopenia      Family History  Problem Relation Age of Onset   Osteoporosis Mother    Osteoporosis Sister    Osteoporosis Maternal Grandmother          ROS denies headache, shortness of breath, chest pain abdominal issues      Objective:   Vitals: BP 134/80 pulse 84 T 98.1  degrees pulse ox 98% Weight 160 lb 6.4 oz BMI 26.29   Physical Exam Skin: Warm and dry.  No cervical adenopathy.  No thyromegaly.  TMs clear.  Pharynx is clear.  Chest clear.  Cardiac exam: Regular rate and rhythm.  Abdomen is soft nondistended without hepatosplenomegaly masses or tenderness.  No lower extremity pitting edema.  Brief neurological exam is intact without gross focal deficits.   Results:   Studies obtained and personally reviewed by me:     Labs: See results from [facility]         Assessment & Plan:   Routine health maintenance exam.  Labs reviewed from [facility] and she will be discussing results with them in the near future.  She has been told she has chronic Epstein-Barr virus infection.  She is interested in weight loss medication.  Have given her information on [facility].  She previously called [facility] but could not get an appointment there.  Says she exercises regularly and watches her diet.  Screening colonoscopy.  This was discussed.  Needs annual mammography.  Deferred to GYN.  Declines COVID-vaccine and flu vaccine.  Tetanus immunization is due but she would like to defer this today.    I,Alexander Ruley,acting as a Education administrator for Elby Showers, MD.,have documented all relevant documentation on the behalf of Elby Showers, MD,as directed by  Elby Showers, MD while in the presence of Elby Showers, MD.  Return in 1 year or as needed.   IElby Showers, MD, have reviewed all documentation for this visit. The documentation on 10/14/22 for the exam, diagnosis, procedures, and orders are all accurate and complete.

## 2022-10-11 ENCOUNTER — Other Ambulatory Visit: Payer: BC Managed Care – PPO

## 2022-10-14 ENCOUNTER — Ambulatory Visit (INDEPENDENT_AMBULATORY_CARE_PROVIDER_SITE_OTHER): Payer: BC Managed Care – PPO | Admitting: Internal Medicine

## 2022-10-14 ENCOUNTER — Encounter: Payer: Self-pay | Admitting: Internal Medicine

## 2022-10-14 VITALS — BP 134/80 | HR 84 | Temp 98.1°F | Ht 65.5 in | Wt 160.4 lb

## 2022-10-14 DIAGNOSIS — Z0000 Encounter for general adult medical examination without abnormal findings: Secondary | ICD-10-CM | POA: Diagnosis not present

## 2022-10-14 NOTE — Patient Instructions (Signed)
It was a pleasure to see you today.  Need to have screening colonoscopy in the near future.  Recommend annual mammogram and this can be done by GYN physician.  Vaccines discussed.

## 2022-10-23 DIAGNOSIS — E039 Hypothyroidism, unspecified: Secondary | ICD-10-CM | POA: Diagnosis not present

## 2022-10-23 DIAGNOSIS — R5382 Chronic fatigue, unspecified: Secondary | ICD-10-CM | POA: Diagnosis not present

## 2022-10-23 DIAGNOSIS — E559 Vitamin D deficiency, unspecified: Secondary | ICD-10-CM | POA: Diagnosis not present

## 2022-10-23 DIAGNOSIS — B2790 Infectious mononucleosis, unspecified without complication: Secondary | ICD-10-CM | POA: Diagnosis not present

## 2022-10-24 ENCOUNTER — Encounter: Payer: Self-pay | Admitting: Internal Medicine

## 2022-10-24 NOTE — Telephone Encounter (Signed)
Called patient to let her know DR Renold Genta no longer deals with attention issues that she will need to call [facility]. She was in another appointment and said she would need to call back.

## 2022-11-17 ENCOUNTER — Encounter: Payer: Self-pay | Admitting: Internal Medicine

## 2022-11-18 NOTE — Telephone Encounter (Signed)
Left voicemail for patient to call the office to schedule an office visit

## 2022-11-20 NOTE — Telephone Encounter (Signed)
Patient called back scheduled an office visit for Thursday

## 2022-11-21 ENCOUNTER — Encounter: Payer: Self-pay | Admitting: Internal Medicine

## 2022-11-21 ENCOUNTER — Ambulatory Visit (INDEPENDENT_AMBULATORY_CARE_PROVIDER_SITE_OTHER): Payer: BC Managed Care – PPO | Admitting: Internal Medicine

## 2022-11-21 VITALS — BP 108/70 | HR 81 | Temp 98.1°F

## 2022-11-21 DIAGNOSIS — J0100 Acute maxillary sinusitis, unspecified: Secondary | ICD-10-CM

## 2022-11-21 DIAGNOSIS — H6692 Otitis media, unspecified, left ear: Secondary | ICD-10-CM

## 2022-11-21 MED ORDER — HYDROCODONE BIT-HOMATROP MBR 5-1.5 MG/5ML PO SOLN: 5.0000 mL | Freq: Three times a day (TID) | ORAL | 0 refills | Status: DC | PRN

## 2022-11-21 MED ORDER — LEVOFLOXACIN 500 MG PO TABS: 500.0000 mg | ORAL_TABLET | Freq: Every day | ORAL | 0 refills | Status: AC

## 2022-11-21 MED ORDER — METHYLPREDNISOLONE 4 MG PO TABS: ORAL_TABLET | ORAL | 0 refills | Status: DC

## 2022-11-21 MED ORDER — SCOPOLAMINE 1 MG/3DAYS TD PT72: 1.0000 | MEDICATED_PATCH | TRANSDERMAL | 12 refills | Status: AC

## 2022-11-21 MED ORDER — TERCONAZOLE 0.4 % VA CREA: 1.0000 | TOPICAL_CREAM | Freq: Every day | VAGINAL | 0 refills | Status: DC

## 2022-11-21 NOTE — Progress Notes (Signed)
Patient Care Team: Elby Showers, MD as PCP - General (Internal Medicine)  Visit Date: 11/21/22  Subjective:    Patient ID: Brittney Cannon , Female   DOB: 08-07-1976, 47 y.o.    MRN: [id number]   47 y.o. Female presents today for cough and left ear pain. Patient has a past medical history of Covid-19 infection.  Reports experiencing cough, head congestion, left ear pain since 11/07/22. Started with head congestion. Has had a sore throat at night.    Past Medical History:  Diagnosis Date   COVID-19 08/31/2019   cold symptoms, sob, loss of taste and smell, all symptoms resolved with quaranatine   Family history of adverse reaction to anesthesia    mother slow to awaken, takes days   Hypothyroidism    IBS (irritable bowel syndrome)    Osteopenia      Family History  Problem Relation Age of Onset   Osteoporosis Mother    Osteoporosis Sister    Osteoporosis Maternal Grandmother     Social History   Social History Narrative   Not on file      Review of Systems  Constitutional:  Negative for fever and malaise/fatigue.  HENT:  Positive for congestion, ear pain (Left) and sore throat (At night).   Eyes:  Negative for blurred vision.  Respiratory:  Positive for cough. Negative for shortness of breath.   Cardiovascular:  Negative for chest pain, palpitations and leg swelling.  Gastrointestinal:  Negative for diarrhea and vomiting.  Musculoskeletal:  Negative for back pain.  Skin:  Negative for rash.  Neurological:  Negative for loss of consciousness and headaches.        Objective:   Vitals: BP 108/70   Pulse 81   Temp 98.1 F (36.7 C) (Tympanic)   LMP 11/03/2022   SpO2 98%    Physical Exam Vitals and nursing note reviewed.  Constitutional:      General: She is not in acute distress.    Appearance: Normal appearance. She is not toxic-appearing.  HENT:     Head: Normocephalic and atraumatic.     Right Ear: Hearing, ear canal and external ear normal.      Left Ear: Hearing, ear canal and external ear normal. Tympanic membrane is erythematous.     Ears:     Comments: Right TM full. Left TM full and erythematous.    Mouth/Throat:     Pharynx: No oropharyngeal exudate.     Comments: Pharynx slightly injected. Pulmonary:     Effort: Pulmonary effort is normal. No respiratory distress.     Breath sounds: Normal breath sounds. No wheezing or rales.  Skin:    General: Skin is warm and dry.  Neurological:     Mental Status: She is alert and oriented to person, place, and time. Mental status is at baseline.  Psychiatric:        Mood and Affect: Mood normal.        Behavior: Behavior normal.        Thought Content: Thought content normal.        Judgment: Judgment normal.       Results:   Studies obtained and personally reviewed by me:    Labs:       Component Value Date/Time   NA 137 10/02/2022 0000   K 3.6 10/02/2022 0000   CL 107 10/02/2022 0000   CO2 27 (A) 10/02/2022 0000   BUN 13 10/02/2022 0000   CREATININE 0.7 10/02/2022 0000  CALCIUM 10.1 10/02/2022 0000   ALBUMIN 4.5 10/02/2022 0000   AST 19 10/02/2022 0000   ALT 10 10/02/2022 0000   ALKPHOS 65 10/02/2022 0000     Lab Results  Component Value Date   WBC 5.8 10/02/2022   HGB 13.4 10/02/2022   HCT 40 10/02/2022   MCV 100.2 (H) 01/17/2020   PLT 279 01/17/2020    Lab Results  Component Value Date   CHOL 206 (A) 10/02/2022   HDL 77 (A) 10/02/2022   LDLCALC 108 10/02/2022   TRIG 107 10/02/2022    No results found for: "HGBA1C"   Lab Results  Component Value Date   TSH 2.17 10/02/2022      Assessment & Plan:   Acute maxillary sinusitis, left otitis media: prescribed Levaquin 500 mg daily, Medrol dosepak tapering course take as directed. Refilled Terazol cream.   Refilled Transderm-Scop patches for upcoming travel.    I,Alexander Ruley,acting as a Education administrator for Elby Showers, MD.,have documented all relevant documentation on the behalf of Elby Showers, MD,as directed by  Elby Showers, MD while in the presence of Elby Showers, MD.   I, Elby Showers, MD, have reviewed all documentation for this visit. The documentation on 11/23/22 for the exam, diagnosis, procedures, and orders are all accurate and complete.

## 2022-11-23 NOTE — Patient Instructions (Addendum)
Take Levaquin 500 mg daily for 7 days and Medrol dose pack as directed for 6 days. Refilled Terazol cream and Transderm scop patches.

## 2022-12-02 DIAGNOSIS — M9902 Segmental and somatic dysfunction of thoracic region: Secondary | ICD-10-CM | POA: Diagnosis not present

## 2022-12-02 DIAGNOSIS — M9905 Segmental and somatic dysfunction of pelvic region: Secondary | ICD-10-CM | POA: Diagnosis not present

## 2022-12-02 DIAGNOSIS — M9903 Segmental and somatic dysfunction of lumbar region: Secondary | ICD-10-CM | POA: Diagnosis not present

## 2022-12-02 DIAGNOSIS — M7918 Myalgia, other site: Secondary | ICD-10-CM | POA: Diagnosis not present

## 2022-12-02 DIAGNOSIS — M25659 Stiffness of unspecified hip, not elsewhere classified: Secondary | ICD-10-CM | POA: Diagnosis not present

## 2022-12-02 DIAGNOSIS — M9904 Segmental and somatic dysfunction of sacral region: Secondary | ICD-10-CM | POA: Diagnosis not present

## 2022-12-03 DIAGNOSIS — R635 Abnormal weight gain: Secondary | ICD-10-CM | POA: Diagnosis not present

## 2022-12-04 DIAGNOSIS — M9903 Segmental and somatic dysfunction of lumbar region: Secondary | ICD-10-CM | POA: Diagnosis not present

## 2022-12-04 DIAGNOSIS — M7918 Myalgia, other site: Secondary | ICD-10-CM | POA: Diagnosis not present

## 2022-12-04 DIAGNOSIS — M9902 Segmental and somatic dysfunction of thoracic region: Secondary | ICD-10-CM | POA: Diagnosis not present

## 2022-12-04 DIAGNOSIS — M9905 Segmental and somatic dysfunction of pelvic region: Secondary | ICD-10-CM | POA: Diagnosis not present

## 2022-12-04 DIAGNOSIS — M9904 Segmental and somatic dysfunction of sacral region: Secondary | ICD-10-CM | POA: Diagnosis not present

## 2022-12-04 DIAGNOSIS — M25659 Stiffness of unspecified hip, not elsewhere classified: Secondary | ICD-10-CM | POA: Diagnosis not present

## 2022-12-05 DIAGNOSIS — Z6825 Body mass index (BMI) 25.0-25.9, adult: Secondary | ICD-10-CM | POA: Diagnosis not present

## 2022-12-05 DIAGNOSIS — Z1331 Encounter for screening for depression: Secondary | ICD-10-CM | POA: Diagnosis not present

## 2022-12-05 DIAGNOSIS — E039 Hypothyroidism, unspecified: Secondary | ICD-10-CM | POA: Diagnosis not present

## 2022-12-05 DIAGNOSIS — Z1339 Encounter for screening examination for other mental health and behavioral disorders: Secondary | ICD-10-CM | POA: Diagnosis not present

## 2022-12-05 DIAGNOSIS — E559 Vitamin D deficiency, unspecified: Secondary | ICD-10-CM | POA: Diagnosis not present

## 2022-12-06 DIAGNOSIS — M7918 Myalgia, other site: Secondary | ICD-10-CM | POA: Diagnosis not present

## 2022-12-06 DIAGNOSIS — M9904 Segmental and somatic dysfunction of sacral region: Secondary | ICD-10-CM | POA: Diagnosis not present

## 2022-12-06 DIAGNOSIS — M9905 Segmental and somatic dysfunction of pelvic region: Secondary | ICD-10-CM | POA: Diagnosis not present

## 2022-12-06 DIAGNOSIS — M9902 Segmental and somatic dysfunction of thoracic region: Secondary | ICD-10-CM | POA: Diagnosis not present

## 2022-12-06 DIAGNOSIS — M9903 Segmental and somatic dysfunction of lumbar region: Secondary | ICD-10-CM | POA: Diagnosis not present

## 2022-12-06 DIAGNOSIS — M25659 Stiffness of unspecified hip, not elsewhere classified: Secondary | ICD-10-CM | POA: Diagnosis not present

## 2022-12-10 DIAGNOSIS — M9903 Segmental and somatic dysfunction of lumbar region: Secondary | ICD-10-CM | POA: Diagnosis not present

## 2022-12-10 DIAGNOSIS — M9904 Segmental and somatic dysfunction of sacral region: Secondary | ICD-10-CM | POA: Diagnosis not present

## 2022-12-10 DIAGNOSIS — M9902 Segmental and somatic dysfunction of thoracic region: Secondary | ICD-10-CM | POA: Diagnosis not present

## 2022-12-10 DIAGNOSIS — M9905 Segmental and somatic dysfunction of pelvic region: Secondary | ICD-10-CM | POA: Diagnosis not present

## 2022-12-12 DIAGNOSIS — Z6825 Body mass index (BMI) 25.0-25.9, adult: Secondary | ICD-10-CM | POA: Diagnosis not present

## 2022-12-12 DIAGNOSIS — E039 Hypothyroidism, unspecified: Secondary | ICD-10-CM | POA: Diagnosis not present

## 2022-12-13 DIAGNOSIS — M9903 Segmental and somatic dysfunction of lumbar region: Secondary | ICD-10-CM | POA: Diagnosis not present

## 2022-12-13 DIAGNOSIS — M9905 Segmental and somatic dysfunction of pelvic region: Secondary | ICD-10-CM | POA: Diagnosis not present

## 2022-12-13 DIAGNOSIS — M9902 Segmental and somatic dysfunction of thoracic region: Secondary | ICD-10-CM | POA: Diagnosis not present

## 2022-12-13 DIAGNOSIS — M9904 Segmental and somatic dysfunction of sacral region: Secondary | ICD-10-CM | POA: Diagnosis not present

## 2022-12-16 DIAGNOSIS — M9904 Segmental and somatic dysfunction of sacral region: Secondary | ICD-10-CM | POA: Diagnosis not present

## 2022-12-16 DIAGNOSIS — M9905 Segmental and somatic dysfunction of pelvic region: Secondary | ICD-10-CM | POA: Diagnosis not present

## 2022-12-16 DIAGNOSIS — M25659 Stiffness of unspecified hip, not elsewhere classified: Secondary | ICD-10-CM | POA: Diagnosis not present

## 2022-12-16 DIAGNOSIS — N898 Other specified noninflammatory disorders of vagina: Secondary | ICD-10-CM | POA: Diagnosis not present

## 2022-12-16 DIAGNOSIS — M7918 Myalgia, other site: Secondary | ICD-10-CM | POA: Diagnosis not present

## 2022-12-16 DIAGNOSIS — N939 Abnormal uterine and vaginal bleeding, unspecified: Secondary | ICD-10-CM | POA: Diagnosis not present

## 2022-12-16 DIAGNOSIS — M9903 Segmental and somatic dysfunction of lumbar region: Secondary | ICD-10-CM | POA: Diagnosis not present

## 2022-12-16 DIAGNOSIS — R5383 Other fatigue: Secondary | ICD-10-CM | POA: Diagnosis not present

## 2022-12-16 DIAGNOSIS — M9902 Segmental and somatic dysfunction of thoracic region: Secondary | ICD-10-CM | POA: Diagnosis not present

## 2022-12-18 DIAGNOSIS — E039 Hypothyroidism, unspecified: Secondary | ICD-10-CM | POA: Diagnosis not present

## 2022-12-18 DIAGNOSIS — Z6825 Body mass index (BMI) 25.0-25.9, adult: Secondary | ICD-10-CM | POA: Diagnosis not present

## 2022-12-20 DIAGNOSIS — J45901 Unspecified asthma with (acute) exacerbation: Secondary | ICD-10-CM | POA: Diagnosis not present

## 2022-12-20 DIAGNOSIS — H6993 Unspecified Eustachian tube disorder, bilateral: Secondary | ICD-10-CM | POA: Diagnosis not present

## 2022-12-20 DIAGNOSIS — J3089 Other allergic rhinitis: Secondary | ICD-10-CM | POA: Diagnosis not present

## 2022-12-20 DIAGNOSIS — R0602 Shortness of breath: Secondary | ICD-10-CM | POA: Diagnosis not present

## 2022-12-25 DIAGNOSIS — E039 Hypothyroidism, unspecified: Secondary | ICD-10-CM | POA: Diagnosis not present

## 2022-12-25 DIAGNOSIS — R635 Abnormal weight gain: Secondary | ICD-10-CM | POA: Diagnosis not present

## 2022-12-27 DIAGNOSIS — E559 Vitamin D deficiency, unspecified: Secondary | ICD-10-CM | POA: Diagnosis not present

## 2022-12-27 DIAGNOSIS — E538 Deficiency of other specified B group vitamins: Secondary | ICD-10-CM | POA: Diagnosis not present

## 2022-12-27 DIAGNOSIS — E039 Hypothyroidism, unspecified: Secondary | ICD-10-CM | POA: Diagnosis not present

## 2022-12-27 DIAGNOSIS — R5382 Chronic fatigue, unspecified: Secondary | ICD-10-CM | POA: Diagnosis not present

## 2022-12-27 DIAGNOSIS — B2790 Infectious mononucleosis, unspecified without complication: Secondary | ICD-10-CM | POA: Diagnosis not present

## 2022-12-27 DIAGNOSIS — D8989 Other specified disorders involving the immune mechanism, not elsewhere classified: Secondary | ICD-10-CM | POA: Diagnosis not present

## 2023-01-01 ENCOUNTER — Telehealth: Payer: Self-pay | Admitting: Internal Medicine

## 2023-01-01 DIAGNOSIS — E559 Vitamin D deficiency, unspecified: Secondary | ICD-10-CM | POA: Diagnosis not present

## 2023-01-01 DIAGNOSIS — E039 Hypothyroidism, unspecified: Secondary | ICD-10-CM | POA: Diagnosis not present

## 2023-01-01 DIAGNOSIS — Z87898 Personal history of other specified conditions: Secondary | ICD-10-CM | POA: Diagnosis not present

## 2023-01-01 NOTE — Telephone Encounter (Signed)
Brittney Cannon [phone]  Brittney Cannon called to say for the last several weeks she has been feeling light headed when she moves her head or when she bends over. When she went to her Weight loss doctor or her integrated doctor they told her it was Vertigo and she needed to follow up with her PCP.

## 2023-01-01 NOTE — Telephone Encounter (Signed)
Scheduled an appointment, then she was asking if she really needed an appointment for a referral to ENT

## 2023-01-02 ENCOUNTER — Ambulatory Visit: Payer: BC Managed Care – PPO | Admitting: Internal Medicine

## 2023-01-02 NOTE — Progress Notes (Shared)
    Patient Care Team: Margaree Mackintosh, MD as PCP - General (Internal Medicine)  Visit Date: 01/02/23  Subjective:    Patient ID: Brittney Cannon , Female   DOB: 1976-08-21, 47 y.o.    MRN: [id number]   47 y.o. Female presents today for vertigo, lightheadedness since . Symptoms come on when she moves her head from side to side or bends over.   Past Medical History:  Diagnosis Date   COVID-19 08/31/2019   cold symptoms, sob, loss of taste and smell, all symptoms resolved with quaranatine   Family history of adverse reaction to anesthesia    mother slow to awaken, takes days   Hypothyroidism    IBS (irritable bowel syndrome)    Osteopenia      Family History  Problem Relation Age of Onset   Osteoporosis Mother    Osteoporosis Sister    Osteoporosis Maternal Grandmother     Social History   Social History Narrative   Not on file      ROS      Objective:   Vitals: There were no vitals taken for this visit.   Physical Exam    Results:   Studies obtained and personally reviewed by me:  Imaging, colonoscopy, mammogram, bone density scan, echocardiogram, heart cath, stress test, CT calcium score, etc. ***   Labs:       Component Value Date/Time   NA 137 10/02/2022 0000   K 3.6 10/02/2022 0000   CL 107 10/02/2022 0000   CO2 27 (A) 10/02/2022 0000   BUN 13 10/02/2022 0000   CREATININE 0.7 10/02/2022 0000   CALCIUM 10.1 10/02/2022 0000   ALBUMIN 4.5 10/02/2022 0000   AST 19 10/02/2022 0000   ALT 10 10/02/2022 0000   ALKPHOS 65 10/02/2022 0000     Lab Results  Component Value Date   WBC 5.8 10/02/2022   HGB 13.4 10/02/2022   HCT 40 10/02/2022   MCV 100.2 (H) 01/17/2020   PLT 279 01/17/2020    Lab Results  Component Value Date   CHOL 206 (A) 10/02/2022   HDL 77 (A) 10/02/2022   LDLCALC 108 10/02/2022   TRIG 107 10/02/2022    No results found for: "HGBA1C"   Lab Results  Component Value Date   TSH 2.17 10/02/2022     No results found  for: "PSA1", "PSA" *** delete for female pts  ***    Assessment & Plan:   ***    I,Alexander Ruley,acting as a scribe for Margaree Mackintosh, MD.,have documented all relevant documentation on the behalf of Margaree Mackintosh, MD,as directed by  Margaree Mackintosh, MD while in the presence of Margaree Mackintosh, MD.   ***

## 2023-01-17 DIAGNOSIS — R5382 Chronic fatigue, unspecified: Secondary | ICD-10-CM | POA: Diagnosis not present

## 2023-01-17 DIAGNOSIS — B2790 Infectious mononucleosis, unspecified without complication: Secondary | ICD-10-CM | POA: Diagnosis not present

## 2023-01-17 DIAGNOSIS — E559 Vitamin D deficiency, unspecified: Secondary | ICD-10-CM | POA: Diagnosis not present

## 2023-01-17 DIAGNOSIS — E039 Hypothyroidism, unspecified: Secondary | ICD-10-CM | POA: Diagnosis not present

## 2023-01-22 DIAGNOSIS — R42 Dizziness and giddiness: Secondary | ICD-10-CM | POA: Diagnosis not present

## 2023-01-22 DIAGNOSIS — E559 Vitamin D deficiency, unspecified: Secondary | ICD-10-CM | POA: Diagnosis not present

## 2023-01-22 DIAGNOSIS — Z6825 Body mass index (BMI) 25.0-25.9, adult: Secondary | ICD-10-CM | POA: Diagnosis not present

## 2023-01-22 DIAGNOSIS — H938X3 Other specified disorders of ear, bilateral: Secondary | ICD-10-CM | POA: Diagnosis not present

## 2023-01-22 DIAGNOSIS — H9313 Tinnitus, bilateral: Secondary | ICD-10-CM | POA: Diagnosis not present

## 2023-01-23 DIAGNOSIS — R42 Dizziness and giddiness: Secondary | ICD-10-CM | POA: Diagnosis not present

## 2023-01-24 DIAGNOSIS — M9902 Segmental and somatic dysfunction of thoracic region: Secondary | ICD-10-CM | POA: Diagnosis not present

## 2023-01-24 DIAGNOSIS — M9904 Segmental and somatic dysfunction of sacral region: Secondary | ICD-10-CM | POA: Diagnosis not present

## 2023-01-24 DIAGNOSIS — M9905 Segmental and somatic dysfunction of pelvic region: Secondary | ICD-10-CM | POA: Diagnosis not present

## 2023-01-24 DIAGNOSIS — M9903 Segmental and somatic dysfunction of lumbar region: Secondary | ICD-10-CM | POA: Diagnosis not present

## 2023-01-27 DIAGNOSIS — M9902 Segmental and somatic dysfunction of thoracic region: Secondary | ICD-10-CM | POA: Diagnosis not present

## 2023-01-27 DIAGNOSIS — M9904 Segmental and somatic dysfunction of sacral region: Secondary | ICD-10-CM | POA: Diagnosis not present

## 2023-01-27 DIAGNOSIS — M9903 Segmental and somatic dysfunction of lumbar region: Secondary | ICD-10-CM | POA: Diagnosis not present

## 2023-01-27 DIAGNOSIS — M9905 Segmental and somatic dysfunction of pelvic region: Secondary | ICD-10-CM | POA: Diagnosis not present

## 2023-01-27 DIAGNOSIS — M25659 Stiffness of unspecified hip, not elsewhere classified: Secondary | ICD-10-CM | POA: Diagnosis not present

## 2023-01-27 DIAGNOSIS — M7918 Myalgia, other site: Secondary | ICD-10-CM | POA: Diagnosis not present

## 2023-01-31 DIAGNOSIS — M25659 Stiffness of unspecified hip, not elsewhere classified: Secondary | ICD-10-CM | POA: Diagnosis not present

## 2023-01-31 DIAGNOSIS — M9905 Segmental and somatic dysfunction of pelvic region: Secondary | ICD-10-CM | POA: Diagnosis not present

## 2023-01-31 DIAGNOSIS — M9904 Segmental and somatic dysfunction of sacral region: Secondary | ICD-10-CM | POA: Diagnosis not present

## 2023-01-31 DIAGNOSIS — M7918 Myalgia, other site: Secondary | ICD-10-CM | POA: Diagnosis not present

## 2023-01-31 DIAGNOSIS — M9902 Segmental and somatic dysfunction of thoracic region: Secondary | ICD-10-CM | POA: Diagnosis not present

## 2023-01-31 DIAGNOSIS — M9903 Segmental and somatic dysfunction of lumbar region: Secondary | ICD-10-CM | POA: Diagnosis not present

## 2023-02-04 DIAGNOSIS — Z6825 Body mass index (BMI) 25.0-25.9, adult: Secondary | ICD-10-CM | POA: Diagnosis not present

## 2023-02-04 DIAGNOSIS — E559 Vitamin D deficiency, unspecified: Secondary | ICD-10-CM | POA: Diagnosis not present

## 2023-02-05 DIAGNOSIS — M9903 Segmental and somatic dysfunction of lumbar region: Secondary | ICD-10-CM | POA: Diagnosis not present

## 2023-02-05 DIAGNOSIS — M9904 Segmental and somatic dysfunction of sacral region: Secondary | ICD-10-CM | POA: Diagnosis not present

## 2023-02-05 DIAGNOSIS — M9902 Segmental and somatic dysfunction of thoracic region: Secondary | ICD-10-CM | POA: Diagnosis not present

## 2023-02-05 DIAGNOSIS — M9905 Segmental and somatic dysfunction of pelvic region: Secondary | ICD-10-CM | POA: Diagnosis not present

## 2023-02-05 DIAGNOSIS — M25659 Stiffness of unspecified hip, not elsewhere classified: Secondary | ICD-10-CM | POA: Diagnosis not present

## 2023-02-05 DIAGNOSIS — M7918 Myalgia, other site: Secondary | ICD-10-CM | POA: Diagnosis not present

## 2023-02-12 DIAGNOSIS — M7918 Myalgia, other site: Secondary | ICD-10-CM | POA: Diagnosis not present

## 2023-02-12 DIAGNOSIS — M9904 Segmental and somatic dysfunction of sacral region: Secondary | ICD-10-CM | POA: Diagnosis not present

## 2023-02-12 DIAGNOSIS — M25659 Stiffness of unspecified hip, not elsewhere classified: Secondary | ICD-10-CM | POA: Diagnosis not present

## 2023-02-12 DIAGNOSIS — M9902 Segmental and somatic dysfunction of thoracic region: Secondary | ICD-10-CM | POA: Diagnosis not present

## 2023-02-12 DIAGNOSIS — M9905 Segmental and somatic dysfunction of pelvic region: Secondary | ICD-10-CM | POA: Diagnosis not present

## 2023-02-12 DIAGNOSIS — M9903 Segmental and somatic dysfunction of lumbar region: Secondary | ICD-10-CM | POA: Diagnosis not present

## 2023-03-04 DIAGNOSIS — E039 Hypothyroidism, unspecified: Secondary | ICD-10-CM | POA: Diagnosis not present

## 2023-03-04 DIAGNOSIS — Z6824 Body mass index (BMI) 24.0-24.9, adult: Secondary | ICD-10-CM | POA: Diagnosis not present

## 2023-03-11 DIAGNOSIS — E039 Hypothyroidism, unspecified: Secondary | ICD-10-CM | POA: Diagnosis not present

## 2023-03-11 DIAGNOSIS — Z87898 Personal history of other specified conditions: Secondary | ICD-10-CM | POA: Diagnosis not present

## 2023-03-17 DIAGNOSIS — M9903 Segmental and somatic dysfunction of lumbar region: Secondary | ICD-10-CM | POA: Diagnosis not present

## 2023-03-17 DIAGNOSIS — M7918 Myalgia, other site: Secondary | ICD-10-CM | POA: Diagnosis not present

## 2023-03-17 DIAGNOSIS — M9904 Segmental and somatic dysfunction of sacral region: Secondary | ICD-10-CM | POA: Diagnosis not present

## 2023-03-17 DIAGNOSIS — M9902 Segmental and somatic dysfunction of thoracic region: Secondary | ICD-10-CM | POA: Diagnosis not present

## 2023-03-17 DIAGNOSIS — M25659 Stiffness of unspecified hip, not elsewhere classified: Secondary | ICD-10-CM | POA: Diagnosis not present

## 2023-03-17 DIAGNOSIS — M9905 Segmental and somatic dysfunction of pelvic region: Secondary | ICD-10-CM | POA: Diagnosis not present

## 2023-03-18 DIAGNOSIS — M9904 Segmental and somatic dysfunction of sacral region: Secondary | ICD-10-CM | POA: Diagnosis not present

## 2023-03-18 DIAGNOSIS — M9902 Segmental and somatic dysfunction of thoracic region: Secondary | ICD-10-CM | POA: Diagnosis not present

## 2023-03-18 DIAGNOSIS — M7918 Myalgia, other site: Secondary | ICD-10-CM | POA: Diagnosis not present

## 2023-03-18 DIAGNOSIS — M9905 Segmental and somatic dysfunction of pelvic region: Secondary | ICD-10-CM | POA: Diagnosis not present

## 2023-03-18 DIAGNOSIS — M25659 Stiffness of unspecified hip, not elsewhere classified: Secondary | ICD-10-CM | POA: Diagnosis not present

## 2023-03-18 DIAGNOSIS — M9903 Segmental and somatic dysfunction of lumbar region: Secondary | ICD-10-CM | POA: Diagnosis not present

## 2023-03-31 DIAGNOSIS — Z6823 Body mass index (BMI) 23.0-23.9, adult: Secondary | ICD-10-CM | POA: Diagnosis not present

## 2023-03-31 DIAGNOSIS — E039 Hypothyroidism, unspecified: Secondary | ICD-10-CM | POA: Diagnosis not present

## 2023-04-10 DIAGNOSIS — N939 Abnormal uterine and vaginal bleeding, unspecified: Secondary | ICD-10-CM | POA: Diagnosis not present

## 2023-04-16 DIAGNOSIS — E559 Vitamin D deficiency, unspecified: Secondary | ICD-10-CM | POA: Diagnosis not present

## 2023-04-16 DIAGNOSIS — Z87898 Personal history of other specified conditions: Secondary | ICD-10-CM | POA: Diagnosis not present

## 2023-05-01 DIAGNOSIS — D239 Other benign neoplasm of skin, unspecified: Secondary | ICD-10-CM | POA: Diagnosis not present

## 2023-05-01 DIAGNOSIS — L821 Other seborrheic keratosis: Secondary | ICD-10-CM | POA: Diagnosis not present

## 2023-05-01 DIAGNOSIS — L570 Actinic keratosis: Secondary | ICD-10-CM | POA: Diagnosis not present

## 2023-05-20 DIAGNOSIS — Z87898 Personal history of other specified conditions: Secondary | ICD-10-CM | POA: Diagnosis not present

## 2023-05-20 DIAGNOSIS — E559 Vitamin D deficiency, unspecified: Secondary | ICD-10-CM | POA: Diagnosis not present

## 2023-05-22 DIAGNOSIS — H9311 Tinnitus, right ear: Secondary | ICD-10-CM | POA: Diagnosis not present

## 2023-05-22 DIAGNOSIS — Z01118 Encounter for examination of ears and hearing with other abnormal findings: Secondary | ICD-10-CM | POA: Diagnosis not present

## 2023-05-22 DIAGNOSIS — H903 Sensorineural hearing loss, bilateral: Secondary | ICD-10-CM | POA: Diagnosis not present

## 2023-07-29 ENCOUNTER — Encounter: Payer: Self-pay | Admitting: Internal Medicine

## 2023-07-29 ENCOUNTER — Telehealth (INDEPENDENT_AMBULATORY_CARE_PROVIDER_SITE_OTHER): Payer: BC Managed Care – PPO | Admitting: Internal Medicine

## 2023-07-29 ENCOUNTER — Other Ambulatory Visit: Payer: Self-pay

## 2023-07-29 VITALS — BP 115/65 | Ht 65.5 in | Wt 160.0 lb

## 2023-07-29 DIAGNOSIS — N946 Dysmenorrhea, unspecified: Secondary | ICD-10-CM

## 2023-07-29 MED ORDER — NAPROXEN 500 MG PO TABS: ORAL_TABLET | ORAL | 3 refills | Status: AC

## 2023-07-29 NOTE — Telephone Encounter (Signed)
scheduled

## 2023-07-29 NOTE — Progress Notes (Signed)
Patient Care Team: Margaree Mackintosh, MD as PCP - General (Internal Medicine)  I connected with Brittney Cannon on 07/29/23 at 4:39 PM by video enabled telemedicine visit and verified that I am speaking with the correct person using two identifiers.   I discussed the limitations, risks, security and privacy concerns of performing an evaluation and management service by telemedicine and the availability of in-person appointments. I also discussed with the patient that there may be a patient responsible charge related to this service. The patient expressed understanding and agreed to proceed.   Other persons participating in the visit and their role in the encounter: Medical scribe, Doylene Bode  Patient's location: Home  Provider's location: Clinic   I provided 15 minutes of face-to-face video visit time during this encounter, and > 50% was spent counseling as documented under my assessment & plan. She is identified using two identifiers, Brittney Cannon, a patient of this practice. She is in her house and I am in my practice. She is agreeable to using this format today.  Chief Complaint: dysmenorrhea   Subjective:    Patient ID: Brittney Cannon , Female    DOB: [birth year], 47 y.o.    MRN: [id number]   47 y.o. Female presents today for dysmenorrhea. Taking Advil without relief. She is out of naproxen and is requesting a refill. Flow during menses has been medium, which is normal for her. Menses last 5-7 days and this is normal for her. She has been having severe cramps associated with menses.   Past Medical History:  Diagnosis Date   COVID-19 08/31/2019   cold symptoms, sob, loss of taste and smell, all symptoms resolved with quaranatine   Family history of adverse reaction to anesthesia    mother slow to awaken, takes days   Hypothyroidism    IBS (irritable bowel syndrome)    Osteopenia      Family History  Problem Relation Age of Onset   Osteoporosis Mother    Osteoporosis Sister     Osteoporosis Maternal Grandmother     Social history: Reviewed and unchanged     Review of Systems  Constitutional:  Negative for fever and malaise/fatigue.  HENT:  Negative for congestion.   Eyes:  Negative for blurred vision.  Respiratory:  Negative for cough and shortness of breath.   Cardiovascular:  Negative for chest pain, palpitations and leg swelling.  Gastrointestinal:  Negative for vomiting.       (+) Abdominal cramping  Musculoskeletal:  Negative for back pain.  Skin:  Negative for rash.  Neurological:  Negative for loss of consciousness and headaches.        Objective:   Vitals: BP 115/65   Ht 5' 5.5" (1.664 m)   Wt 160 lb (72.6 kg)   BMI 26.22 kg/m    Physical Exam Vitals and nursing note reviewed.  Constitutional:      General: She is not in acute distress.    Appearance: Normal appearance. She is not toxic-appearing.  HENT:     Head: Normocephalic and atraumatic.  Pulmonary:     Effort: Pulmonary effort is normal.  Skin:    General: Skin is warm and dry.  Neurological:     Mental Status: She is alert and oriented to person, place, and time. Mental status is at baseline.  Psychiatric:        Mood and Affect: Mood normal.        Behavior: Behavior normal.  Thought Content: Thought content normal.        Judgment: Judgment normal.       Results:   Studies obtained and personally reviewed by me:   Labs:       Component Value Date/Time   NA 137 10/02/2022 0000   K 3.6 10/02/2022 0000   CL 107 10/02/2022 0000   CO2 27 (A) 10/02/2022 0000   BUN 13 10/02/2022 0000   CREATININE 0.7 10/02/2022 0000   CALCIUM 10.1 10/02/2022 0000   ALBUMIN 4.5 10/02/2022 0000   AST 19 10/02/2022 0000   ALT 10 10/02/2022 0000   ALKPHOS 65 10/02/2022 0000     Lab Results  Component Value Date   WBC 5.8 10/02/2022   HGB 13.4 10/02/2022   HCT 40 10/02/2022   MCV 100.2 (H) 01/17/2020   PLT 279 01/17/2020    Lab Results  Component Value Date    CHOL 206 (A) 10/02/2022   HDL 77 (A) 10/02/2022   LDLCALC 108 10/02/2022   TRIG 107 10/02/2022    No results found for: "HGBA1C"   Lab Results  Component Value Date   TSH 2.17 10/02/2022      Assessment & Plan:   Dysmenorrhea: prescribed naproxen 500 mg twice daily with food as needed for dysmenorrhea.  Sent in number 30 tablets with 3 refills.  Contact us if symptoms worsen or fail to improve.    I,Alexander Ruley,acting as a Neurosurgeon for Margaree Mackintosh, MD.,have documented all relevant documentation on the behalf of Margaree Mackintosh, MD,as directed by  Margaree Mackintosh, MD while in the presence of Margaree Mackintosh, MD.   I, Margaree Mackintosh, MD, have reviewed all documentation for this visit. The documentation on 07/30/23 for the exam, diagnosis, procedures, and orders are all accurate and complete.

## 2023-07-29 NOTE — Telephone Encounter (Signed)
Per patient mychart message  Good morning. I thought I had Naproxen left, as I use a tablet occasionally for period cramps, but I guess I've run out. Apparently I don't use many of them. Can a refill be called in? My period cramps are getting to me and Advil isn't working this month.

## 2023-07-30 ENCOUNTER — Encounter: Payer: Self-pay | Admitting: Internal Medicine

## 2023-07-30 NOTE — Patient Instructions (Signed)
Patient has history of dysmenorrhea and Naprosyn works well for her.  Have sent in Naprosyn 500 mg twice daily as needed for dysmenorrhea.

## 2023-08-01 ENCOUNTER — Telehealth (INDEPENDENT_AMBULATORY_CARE_PROVIDER_SITE_OTHER): Payer: BC Managed Care – PPO | Admitting: Internal Medicine

## 2023-08-01 ENCOUNTER — Telehealth: Payer: Self-pay | Admitting: Internal Medicine

## 2023-08-01 DIAGNOSIS — J22 Unspecified acute lower respiratory infection: Secondary | ICD-10-CM

## 2023-08-01 MED ORDER — LEVOFLOXACIN 500 MG PO TABS: 500.0000 mg | ORAL_TABLET | Freq: Every day | ORAL | 0 refills | Status: AC

## 2023-08-01 NOTE — Telephone Encounter (Signed)
I called Brittney Cannon about something and she was just coming from her for her son that has PNA, she was horse, coughing up phlegm and said she had been in the bed for a few days with fatigued.

## 2023-08-01 NOTE — Telephone Encounter (Signed)
Scheduled video mychart message with Dr. Lenord Fellers.

## 2023-08-01 NOTE — Progress Notes (Signed)
Patient Care Team: Margaree Mackintosh, MD as PCP - General (Internal Medicine)  I connected with Brittney Cannon on 08/01/23 at 5:08 PM by video enabled telemedicine visit and verified that I am speaking with the correct person using two identifiers.   I discussed the limitations, risks, security and privacy concerns of performing an evaluation and management service by telemedicine and the availability of in-person appointments. I also discussed with the patient that there may be a patient responsible charge related to this service. The patient expressed understanding and agreed to proceed.   Other persons participating in the visit and their role in the encounter: Medical scribe, Brittney Cannon  Patient's location: Home  Provider's location: Clinic   I provided 10 minutes of face-to-face video visit time during this encounter, and > 50% was spent counseling as documented under my assessment & plan. She is identified using two identifiers, Brittney Cannon, a patient of this practice. She is out running errands and I am in my practice. She is agreeable to using this format today.  Chief Complaint: cough, fatigue   Subjective:    Patient ID: Brittney Cannon , Female    DOB: 12-17-1975, 47 y.o.    MRN: [id number]   47 y.o. Female presents today for cough with sputum, hoarseness, sore throat, fatigue, fever recently. She is taking Mucinex DM, Delsym. Has some back pain. Denies shaking chills, vomiting, diarrhea, headache.  Past Medical History:  Diagnosis Date   COVID-19 08/31/2019   cold symptoms, sob, loss of taste and smell, all symptoms resolved with quaranatine   Family history of adverse reaction to anesthesia    mother slow to awaken, takes days   Hypothyroidism    IBS (irritable bowel syndrome)    Osteopenia      Family History  Problem Relation Age of Onset   Osteoporosis Mother    Osteoporosis Sister    Osteoporosis Maternal Grandmother     Social History   Social History  Narrative   Not on file      Review of Systems  Constitutional:  Positive for fever and malaise/fatigue. Negative for chills.  HENT:  Positive for sore throat. Negative for congestion.   Eyes:  Negative for blurred vision.  Respiratory:  Positive for cough and sputum production. Negative for shortness of breath.   Cardiovascular:  Negative for chest pain, palpitations and leg swelling.  Gastrointestinal:  Negative for diarrhea, nausea and vomiting.  Musculoskeletal:  Negative for back pain.  Skin:  Negative for rash.  Neurological:  Negative for loss of consciousness and headaches.        Objective:   Vitals: There were no vitals taken for this visit.   Physical Exam Vitals and nursing note reviewed.  Constitutional:      General: She is not in acute distress.    Appearance: Normal appearance. She is not toxic-appearing.  HENT:     Head: Normocephalic and atraumatic.  Pulmonary:     Effort: Pulmonary effort is normal.  Skin:    General: Skin is warm and dry.  Neurological:     Mental Status: She is alert and oriented to person, place, and time. Mental status is at baseline.  Psychiatric:        Mood and Affect: Mood normal.        Behavior: Behavior normal.        Thought Content: Thought content normal.        Judgment: Judgment normal.  Results:   Studies obtained and personally reviewed by me:   Labs:       Component Value Date/Time   NA 137 10/02/2022 0000   K 3.6 10/02/2022 0000   CL 107 10/02/2022 0000   CO2 27 (A) 10/02/2022 0000   BUN 13 10/02/2022 0000   CREATININE 0.7 10/02/2022 0000   CALCIUM 10.1 10/02/2022 0000   ALBUMIN 4.5 10/02/2022 0000   AST 19 10/02/2022 0000   ALT 10 10/02/2022 0000   ALKPHOS 65 10/02/2022 0000     Lab Results  Component Value Date   WBC 5.8 10/02/2022   HGB 13.4 10/02/2022   HCT 40 10/02/2022   MCV 100.2 (H) 01/17/2020   PLT 279 01/17/2020    Lab Results  Component Value Date   CHOL 206 (A)  10/02/2022   HDL 77 (A) 10/02/2022   LDLCALC 108 10/02/2022   TRIG 107 10/02/2022    No results found for: "HGBA1C"   Lab Results  Component Value Date   TSH 2.17 10/02/2022      Assessment & Plan:   Acute lower respiratory infection: prescribed levofloxacin 500 mg daily with meal for 7 days. Walk regularly. Contact us if no improvement by 08/04/23.    I,Alexander Ruley,acting as a Neurosurgeon for Margaree Mackintosh, MD.,have documented all relevant documentation on the behalf of Margaree Mackintosh, MD,as directed by  Margaree Mackintosh, MD while in the presence of Margaree Mackintosh, MD.   ***

## 2023-08-05 ENCOUNTER — Encounter: Payer: Self-pay | Admitting: Internal Medicine

## 2023-08-05 NOTE — Patient Instructions (Signed)
Take Levaquin 500 mg daily x 7days. Call if not better by December 9th.

## 2023-08-22 ENCOUNTER — Other Ambulatory Visit: Payer: Self-pay | Admitting: Internal Medicine

## 2023-08-22 MED ORDER — ALPRAZOLAM 0.5 MG PO TABS: ORAL_TABLET | ORAL | 0 refills | Status: AC

## 2023-10-03 DIAGNOSIS — N939 Abnormal uterine and vaginal bleeding, unspecified: Secondary | ICD-10-CM | POA: Diagnosis not present

## 2023-10-04 DIAGNOSIS — N939 Abnormal uterine and vaginal bleeding, unspecified: Secondary | ICD-10-CM | POA: Diagnosis not present

## 2023-10-23 DIAGNOSIS — E538 Deficiency of other specified B group vitamins: Secondary | ICD-10-CM | POA: Diagnosis not present

## 2023-10-23 DIAGNOSIS — D8989 Other specified disorders involving the immune mechanism, not elsewhere classified: Secondary | ICD-10-CM | POA: Diagnosis not present

## 2023-10-23 DIAGNOSIS — R5382 Chronic fatigue, unspecified: Secondary | ICD-10-CM | POA: Diagnosis not present

## 2023-10-23 DIAGNOSIS — E039 Hypothyroidism, unspecified: Secondary | ICD-10-CM | POA: Diagnosis not present

## 2023-10-23 DIAGNOSIS — N951 Menopausal and female climacteric states: Secondary | ICD-10-CM | POA: Diagnosis not present

## 2023-10-23 DIAGNOSIS — E559 Vitamin D deficiency, unspecified: Secondary | ICD-10-CM | POA: Diagnosis not present

## 2023-10-23 DIAGNOSIS — E782 Mixed hyperlipidemia: Secondary | ICD-10-CM | POA: Diagnosis not present

## 2023-10-23 DIAGNOSIS — Z131 Encounter for screening for diabetes mellitus: Secondary | ICD-10-CM | POA: Diagnosis not present

## 2023-10-23 LAB — BASIC METABOLIC PANEL WITH GFR
BUN: 13 (ref 4–21)
CO2: 20 (ref 13–22)
Chloride: 102 (ref 99–108)
Creatinine: 0.7 (ref 0.5–1.1)
Glucose: 81
Potassium: 4 meq/L (ref 3.5–5.1)
Sodium: 137 (ref 137–147)

## 2023-10-23 LAB — IRON,TIBC AND FERRITIN PANEL
%SAT: 43
Ferritin: 44
Iron: 194
TIBC: 447
UIBC: 253

## 2023-10-23 LAB — COMPREHENSIVE METABOLIC PANEL WITH GFR
Albumin: 4.8 (ref 3.5–5.0)
Calcium: 9.5 (ref 8.7–10.7)
Globulin: 2.3
eGFR: 103

## 2023-10-23 LAB — TSH: TSH: 1.05 (ref 0.41–5.90)

## 2023-10-23 LAB — VITAMIN B12: Vitamin B-12: 1689

## 2023-10-23 LAB — CBC AND DIFFERENTIAL
HCT: 41 (ref 36–46)
Hemoglobin: 13.8 (ref 12.0–16.0)
Platelets: 280 10*3/uL (ref 150–400)
WBC: 5.3

## 2023-10-23 LAB — TESTOSTERONE: Testosterone: 5

## 2023-10-23 LAB — VITAMIN D 25 HYDROXY (VIT D DEFICIENCY, FRACTURES): Vit D, 25-Hydroxy: 74

## 2023-10-23 LAB — HEMOGLOBIN A1C: Hemoglobin A1C: 5.1

## 2023-10-23 LAB — CBC: RBC: 4.09 (ref 3.87–5.11)

## 2023-10-30 DIAGNOSIS — N939 Abnormal uterine and vaginal bleeding, unspecified: Secondary | ICD-10-CM | POA: Diagnosis not present

## 2023-11-19 DIAGNOSIS — R635 Abnormal weight gain: Secondary | ICD-10-CM | POA: Diagnosis not present

## 2023-11-19 DIAGNOSIS — Z87898 Personal history of other specified conditions: Secondary | ICD-10-CM | POA: Diagnosis not present

## 2023-11-19 DIAGNOSIS — E559 Vitamin D deficiency, unspecified: Secondary | ICD-10-CM | POA: Diagnosis not present

## 2023-11-20 DIAGNOSIS — B2790 Infectious mononucleosis, unspecified without complication: Secondary | ICD-10-CM | POA: Diagnosis not present

## 2023-11-20 DIAGNOSIS — E039 Hypothyroidism, unspecified: Secondary | ICD-10-CM | POA: Diagnosis not present

## 2023-11-20 DIAGNOSIS — R5382 Chronic fatigue, unspecified: Secondary | ICD-10-CM | POA: Diagnosis not present

## 2023-11-20 DIAGNOSIS — E559 Vitamin D deficiency, unspecified: Secondary | ICD-10-CM | POA: Diagnosis not present

## 2023-11-26 DIAGNOSIS — E039 Hypothyroidism, unspecified: Secondary | ICD-10-CM | POA: Diagnosis not present

## 2023-11-26 DIAGNOSIS — Z87898 Personal history of other specified conditions: Secondary | ICD-10-CM | POA: Diagnosis not present

## 2023-12-02 DIAGNOSIS — Z87898 Personal history of other specified conditions: Secondary | ICD-10-CM | POA: Diagnosis not present

## 2023-12-15 DIAGNOSIS — N840 Polyp of corpus uteri: Secondary | ICD-10-CM | POA: Diagnosis not present

## 2023-12-23 ENCOUNTER — Ambulatory Visit (INDEPENDENT_AMBULATORY_CARE_PROVIDER_SITE_OTHER): Admitting: Internal Medicine

## 2023-12-23 VITALS — BP 116/78 | HR 93 | Temp 99.1°F | Ht 65.0 in | Wt 146.8 lb

## 2023-12-23 DIAGNOSIS — F411 Generalized anxiety disorder: Secondary | ICD-10-CM

## 2023-12-23 DIAGNOSIS — R4589 Other symptoms and signs involving emotional state: Secondary | ICD-10-CM | POA: Diagnosis not present

## 2023-12-23 NOTE — Progress Notes (Shared)
 Brittney Care Team: Sylvan Evener, MD as PCP - General (Internal Medicine)  Visit Date: 12/23/23  Subjective:   Chief Complaint  Brittney presents with   Stress  Brittney Cannon,Female DOB:1976-03-23,47 y.o. AVW:[id number]   48 y.o. Female presents today for acute visit with situational stress and anxiety.  Says she had a GYN procedure  one week ago for an endometrial polyp at [facility] and pre-operatively says her EKG was abnormal.  She admits she was very anxious about undergoing anesthesia. I  cannot find a procedure note or EKG through [facility] or Care Everywhere to review at this time. She has signed a release to get records from [facility]. She is getting married this coming Summer. Some anxiety and stress with that although her children are supportive.  She has been seen recently at [facility] and has been receiving Semaglutide injections weekly for weight management since May 2024.  She takes Nature-throid 162.5 mg tabs daily through [facility] in [place]. Had labs done there Feb 2024 with normal free T4 and this report is filed in [employer]. She has taken this medication for years.  She has a history of Attention deficit disorder and was taking Vyvanse 40 mg daily but says she is not taking it at the present time.  Past Medical History:  Diagnosis Date   COVID-19 08/31/2019   cold symptoms, sob, loss of taste and smell, all symptoms resolved with quaranatine   Family history of adverse reaction to anesthesia    mother slow to awaken, takes days   Hypothyroidism    IBS (irritable bowel syndrome)    Osteopenia     Allergies  Allergen Reactions   Doxycycline Nausea And Vomiting    Brittney reported   Azithromycin     rash   Codeine Nausea And Vomiting   Erythromycin Nausea And Vomiting   Lactose Intolerance (Gi)    Latex Dermatitis   Penicillins Hives   Tramadol Itching    Family History  Problem  Relation Age of Onset   Osteoporosis Mother    Osteoporosis Sister    Osteoporosis Maternal Grandmother    Social history: She is a widow but engaged to be married later this year.  She has 2 sons.  Non-smoker.  Does not consume alcohol.  Family history: Father with history of hypertension.  Mother with history of osteoporosis, hip fracture, arthritis and kidney stones.  1 sister in good health.  2 brothers, 1 of whom is overweight.  Reports that she has been told she has persistent Epstein-Barr virus infection also by staff at [facility].  Past medical history: History of sinus surgery and tonsillectomy in 2003.  C-section in 2009.  Total of 3 pregnancies.  Right second toe and metatarsal fracture June 2016.  History of osteopenia and apparently took Forteo a number of years ago.  She is allergic to penicillin.  She says she cannot take Synthroid as she has a severe allergic reaction to it and therefore takes Nature-Throid.  Intolerant of many medications including penicillin which causes a rash and breathing issues, doxycycline causes nausea and vomiting, Biaxin causes an adverse reaction which Brittney cannot remember and Zithromax causes abdominal cramping.  She can only take Levaquin  and Keflex.  Review of Systems  Psychiatric/Behavioral:         (+) Stressed   All other systems reviewed and are negative.    Objective:  Vitals: BP 116/78   Pulse  93   Temp 99.1 F (37.3 C) (Temporal)   Ht 5\' 5"  (1.651 m)   Wt 146 lb 12.8 oz (66.6 kg)   SpO2 98%   BMI 24.43 kg/m   Physical Exam Vitals and nursing note reviewed.  Constitutional:      General: She is not in acute distress.    Appearance: Normal appearance. She is not toxic-appearing.  HENT:     Head: Normocephalic and atraumatic.  Pulmonary:     Effort: Pulmonary effort is normal.  Skin:    General: Skin is warm and dry.  Neurological:     Mental Status: She is alert and oriented to person, place, and  time. Mental status is at baseline.  Psychiatric:        Mood and Affect: Mood normal.        Behavior: Behavior normal.        Thought Content: Thought content normal.        Judgment: Judgment normal.     Results:  Studies Obtained And Personally Reviewed By Me: Labs:     Component Value Date/Time   NA 137 10/02/2022 0000   K 3.6 10/02/2022 0000   CL 107 10/02/2022 0000   CO2 27 (A) 10/02/2022 0000   BUN 13 10/02/2022 0000   CREATININE 0.7 10/02/2022 0000   CALCIUM 10.1 10/02/2022 0000   ALBUMIN 4.5 10/02/2022 0000   AST 19 10/02/2022 0000   ALT 10 10/02/2022 0000   ALKPHOS 65 10/02/2022 0000    Lab Results  Component Value Date   WBC 5.8 10/02/2022   HGB 13.4 10/02/2022   HCT 40 10/02/2022   MCV 100.2 (H) 01/17/2020   PLT 279 01/17/2020   Lab Results  Component Value Date   CHOL 206 (A) 10/02/2022   HDL 77 (A) 10/02/2022   LDLCALC 108 10/02/2022   TRIG 107 10/02/2022   Lab Results  Component Value Date   TSH 2.17 10/02/2022    Assessment & Plan:   She underwent removal of endometrial polyp  for abnormal vaginal bleeding per Dr. Harless Lien  on April 21st. Records received from [facility] indicate Brittney was monitored closely and discharged home in stable condition. Does not mention any reaction to anesthesia. Record says Lidocaine  with !% epinephrine used for local anesthesia.Aaron Aas Resection performed with hysteroscope.Brittney  says she thinks she may had had a reaction to anesthesia.  Biopsy report received today shows fragments of benign endometrial mucosa.  Has been dx with hypothyroidism years ago at [facility] and takes thyroid  replacement. Is monitored there for that issue. TSH checked with result in [facility] Feb 2025 was normal.Iron level was normal then as well.   Anxiety state - with concerns about  her health and stress with upcoming wedding. Suggest seeing Linde Reveal at [facility] for evaluation.   Suggest Cardiology consult including  consideration of coronary calcium scoring. Brittney agreeable to this plan.  Placing referral to [facility] and Brittney will call Linde Reveal at [facility].     I,Emily Lagle,acting as a Neurosurgeon for Sylvan Evener, MD.,have documented all relevant documentation on the behalf of Sylvan Evener, MD,as directed by  Sylvan Evener, MD while in the presence of Sylvan Evener, MD.   I, Sylvan Evener, MD, have reviewed all documentation for this visit. The documentation on 12/24/23 for the exam, diagnosis, procedures, and orders are all accurate and complete.

## 2023-12-24 ENCOUNTER — Encounter: Payer: Self-pay | Admitting: Internal Medicine

## 2023-12-24 DIAGNOSIS — F419 Anxiety disorder, unspecified: Secondary | ICD-10-CM | POA: Insufficient documentation

## 2023-12-24 NOTE — Patient Instructions (Signed)
 Records requested from recent MyoSure procedure [facility] indicate patient had local anesthesia with 1% Xylocaine .  Records indicate patient tolerated procedure well and was discharged home in stable condition.  Patient has anxiety about upcoming wedding and anxiety in general.  She is worried about her health.  Have suggested Cardiology consultation to help alleviate her concerns and also counseling and med consult with [facility]/Lisa Sharri Dee

## 2023-12-31 DIAGNOSIS — M9904 Segmental and somatic dysfunction of sacral region: Secondary | ICD-10-CM | POA: Diagnosis not present

## 2023-12-31 DIAGNOSIS — M9903 Segmental and somatic dysfunction of lumbar region: Secondary | ICD-10-CM | POA: Diagnosis not present

## 2023-12-31 DIAGNOSIS — M9905 Segmental and somatic dysfunction of pelvic region: Secondary | ICD-10-CM | POA: Diagnosis not present

## 2023-12-31 DIAGNOSIS — M5442 Lumbago with sciatica, left side: Secondary | ICD-10-CM | POA: Diagnosis not present

## 2024-01-02 DIAGNOSIS — M5442 Lumbago with sciatica, left side: Secondary | ICD-10-CM | POA: Diagnosis not present

## 2024-01-02 DIAGNOSIS — M7918 Myalgia, other site: Secondary | ICD-10-CM | POA: Diagnosis not present

## 2024-01-02 DIAGNOSIS — M9904 Segmental and somatic dysfunction of sacral region: Secondary | ICD-10-CM | POA: Diagnosis not present

## 2024-01-02 DIAGNOSIS — M7912 Myalgia of auxiliary muscles, head and neck: Secondary | ICD-10-CM | POA: Diagnosis not present

## 2024-01-02 DIAGNOSIS — M25659 Stiffness of unspecified hip, not elsewhere classified: Secondary | ICD-10-CM | POA: Diagnosis not present

## 2024-01-02 DIAGNOSIS — M9903 Segmental and somatic dysfunction of lumbar region: Secondary | ICD-10-CM | POA: Diagnosis not present

## 2024-01-02 DIAGNOSIS — M9905 Segmental and somatic dysfunction of pelvic region: Secondary | ICD-10-CM | POA: Diagnosis not present

## 2024-01-06 DIAGNOSIS — M9903 Segmental and somatic dysfunction of lumbar region: Secondary | ICD-10-CM | POA: Diagnosis not present

## 2024-01-06 DIAGNOSIS — M9905 Segmental and somatic dysfunction of pelvic region: Secondary | ICD-10-CM | POA: Diagnosis not present

## 2024-01-06 DIAGNOSIS — N939 Abnormal uterine and vaginal bleeding, unspecified: Secondary | ICD-10-CM | POA: Diagnosis not present

## 2024-01-06 DIAGNOSIS — M25659 Stiffness of unspecified hip, not elsewhere classified: Secondary | ICD-10-CM | POA: Diagnosis not present

## 2024-01-06 DIAGNOSIS — M5442 Lumbago with sciatica, left side: Secondary | ICD-10-CM | POA: Diagnosis not present

## 2024-01-06 DIAGNOSIS — M7918 Myalgia, other site: Secondary | ICD-10-CM | POA: Diagnosis not present

## 2024-01-06 DIAGNOSIS — M9904 Segmental and somatic dysfunction of sacral region: Secondary | ICD-10-CM | POA: Diagnosis not present

## 2024-01-12 DIAGNOSIS — L719 Rosacea, unspecified: Secondary | ICD-10-CM | POA: Diagnosis not present

## 2024-01-14 DIAGNOSIS — M9905 Segmental and somatic dysfunction of pelvic region: Secondary | ICD-10-CM | POA: Diagnosis not present

## 2024-01-14 DIAGNOSIS — M9903 Segmental and somatic dysfunction of lumbar region: Secondary | ICD-10-CM | POA: Diagnosis not present

## 2024-01-14 DIAGNOSIS — M9904 Segmental and somatic dysfunction of sacral region: Secondary | ICD-10-CM | POA: Diagnosis not present

## 2024-01-14 DIAGNOSIS — M25659 Stiffness of unspecified hip, not elsewhere classified: Secondary | ICD-10-CM | POA: Diagnosis not present

## 2024-01-14 DIAGNOSIS — M7918 Myalgia, other site: Secondary | ICD-10-CM | POA: Diagnosis not present

## 2024-01-14 DIAGNOSIS — M5442 Lumbago with sciatica, left side: Secondary | ICD-10-CM | POA: Diagnosis not present

## 2024-01-16 DIAGNOSIS — M25659 Stiffness of unspecified hip, not elsewhere classified: Secondary | ICD-10-CM | POA: Diagnosis not present

## 2024-01-16 DIAGNOSIS — M9903 Segmental and somatic dysfunction of lumbar region: Secondary | ICD-10-CM | POA: Diagnosis not present

## 2024-01-16 DIAGNOSIS — M5442 Lumbago with sciatica, left side: Secondary | ICD-10-CM | POA: Diagnosis not present

## 2024-01-16 DIAGNOSIS — M9904 Segmental and somatic dysfunction of sacral region: Secondary | ICD-10-CM | POA: Diagnosis not present

## 2024-01-16 DIAGNOSIS — M9905 Segmental and somatic dysfunction of pelvic region: Secondary | ICD-10-CM | POA: Diagnosis not present

## 2024-01-16 DIAGNOSIS — M7918 Myalgia, other site: Secondary | ICD-10-CM | POA: Diagnosis not present

## 2024-01-22 DIAGNOSIS — M7918 Myalgia, other site: Secondary | ICD-10-CM | POA: Diagnosis not present

## 2024-01-22 DIAGNOSIS — M9903 Segmental and somatic dysfunction of lumbar region: Secondary | ICD-10-CM | POA: Diagnosis not present

## 2024-01-22 DIAGNOSIS — M9905 Segmental and somatic dysfunction of pelvic region: Secondary | ICD-10-CM | POA: Diagnosis not present

## 2024-01-22 DIAGNOSIS — M25659 Stiffness of unspecified hip, not elsewhere classified: Secondary | ICD-10-CM | POA: Diagnosis not present

## 2024-01-22 DIAGNOSIS — M5442 Lumbago with sciatica, left side: Secondary | ICD-10-CM | POA: Diagnosis not present

## 2024-01-22 DIAGNOSIS — M9904 Segmental and somatic dysfunction of sacral region: Secondary | ICD-10-CM | POA: Diagnosis not present

## 2024-01-29 DIAGNOSIS — M9904 Segmental and somatic dysfunction of sacral region: Secondary | ICD-10-CM | POA: Diagnosis not present

## 2024-01-29 DIAGNOSIS — M7918 Myalgia, other site: Secondary | ICD-10-CM | POA: Diagnosis not present

## 2024-01-29 DIAGNOSIS — M5442 Lumbago with sciatica, left side: Secondary | ICD-10-CM | POA: Diagnosis not present

## 2024-01-29 DIAGNOSIS — M9903 Segmental and somatic dysfunction of lumbar region: Secondary | ICD-10-CM | POA: Diagnosis not present

## 2024-01-29 DIAGNOSIS — M9905 Segmental and somatic dysfunction of pelvic region: Secondary | ICD-10-CM | POA: Diagnosis not present

## 2024-01-29 DIAGNOSIS — M25659 Stiffness of unspecified hip, not elsewhere classified: Secondary | ICD-10-CM | POA: Diagnosis not present

## 2024-04-27 ENCOUNTER — Encounter: Payer: Self-pay | Admitting: Internal Medicine

## 2024-04-27 ENCOUNTER — Ambulatory Visit (INDEPENDENT_AMBULATORY_CARE_PROVIDER_SITE_OTHER): Admitting: Internal Medicine

## 2024-04-27 VITALS — BP 120/80 | HR 86 | Temp 98.3°F | Ht 65.0 in

## 2024-04-27 DIAGNOSIS — J0100 Acute maxillary sinusitis, unspecified: Secondary | ICD-10-CM | POA: Diagnosis not present

## 2024-04-27 DIAGNOSIS — R519 Headache, unspecified: Secondary | ICD-10-CM | POA: Diagnosis not present

## 2024-04-27 MED ORDER — METHYLPREDNISOLONE 4 MG PO TABS: ORAL_TABLET | ORAL | 0 refills | Status: DC

## 2024-04-27 MED ORDER — LEVOFLOXACIN 500 MG PO TABS: 500.0000 mg | ORAL_TABLET | Freq: Every day | ORAL | 0 refills | Status: AC

## 2024-04-27 MED ORDER — BUTALBITAL-APAP-CAFFEINE 50-325-40 MG PO TABS: ORAL_TABLET | ORAL | 0 refills | Status: AC

## 2024-04-27 MED ORDER — FLUCONAZOLE 150 MG PO TABS: 150.0000 mg | ORAL_TABLET | Freq: Once | ORAL | 1 refills | Status: AC

## 2024-04-27 NOTE — Progress Notes (Shared)
 Patient Care Team: Perri Ronal PARAS, MD as PCP - General (Internal Medicine)  Visit Date: 04/27/24  Subjective:   Chief Complaint  Patient presents with   Headache   Maxillary sinus pressure   Patient PI:Brittney Cannon,Female DOB:1976/03/10,48 y.o. FMW:[id number]   48 y.o.Female presents today for acute sick visit with Headache; Maxillary Sinus Pressure. Patient has a past medical history of Covid-19; Anxiety.  She says that last week she was ill with symptoms of sneezing, clear draining rhinorrhea, and left-sided sinus pressure. This week, while those symptoms have resolved, she has now been experiencing an intermittent left-sided headache for which she was taking Advil Cold/Sinus. Today, this has worsened into a stabbing pain behind her left eye that has not been alleviated after taking Naproxen . Denies any fever/chills, cough, or expectoration of discolored sputum, as well as recent travel. She does reportedly have history of migraines, but her current headache does not feel similar to those   Past Medical History:  Diagnosis Date   COVID-19 08/31/2019   cold symptoms, sob, loss of taste and smell, all symptoms resolved with quaranatine   Family history of adverse reaction to anesthesia    mother slow to awaken, takes days   Hypothyroidism    IBS (irritable bowel syndrome)    Osteopenia     Allergies  Allergen Reactions   Doxycycline Nausea And Vomiting    Patient reported   Azithromycin     rash   Codeine Nausea And Vomiting   Erythromycin Nausea And Vomiting   Lactose Intolerance (Gi)    Latex Dermatitis   Penicillins Hives   Tramadol Itching   Immunization History  Administered Date(s) Administered   Influenza,inj,Quad PF,6+ Mos 10/21/2017   Influenza,inj,Quad PF,6-35 Mos 04/29/2019   Influenza-Unspecified 08/27/2015   Tdap 08/26/2005   Past Surgical History:  Procedure Laterality Date   CESAREAN SECTION  2009   DILATION AND EVACUATION N/A 01/17/2020    Procedure: DILATATION AND EVACUATION;  Surgeon: Gorge Ade, MD;  Location: [facility];  Service: Gynecology;  Laterality: N/A;   TONSILLECTOMY  2003    Family History  Problem Relation Age of Onset   Osteoporosis Mother    Osteoporosis Sister    Osteoporosis Maternal Grandmother    Social Hx:  Review of Systems  Constitutional:  Negative for fever and malaise/fatigue.  HENT:  Positive for congestion (sinus, left) and sinus pain (pressure).   Eyes:  Positive for pain (behind left eye). Negative for blurred vision.  Respiratory:  Negative for cough, sputum production and shortness of breath.   Cardiovascular:  Negative for chest pain, palpitations and leg swelling.  Gastrointestinal:  Negative for vomiting.  Musculoskeletal:  Negative for back pain.  Skin:  Negative for rash.  Neurological:  Positive for headaches. Negative for loss of consciousness.     Objective:  Vitals: BP 120/80   Pulse 86   Temp 98.3 F (36.8 C)   Ht 5' 5 (1.651 m)   SpO2 97%   BMI 24.43 kg/m   Physical Exam Vitals and nursing note reviewed.  Constitutional:      General: She is not in acute distress.    Appearance: Normal appearance. She is not ill-appearing or toxic-appearing.  HENT:     Head: Normocephalic and atraumatic.     Right Ear: Ear canal and external ear normal.     Left Ear: Ear canal and external ear normal.     Ears:     Comments: RIGHT TM slightly full,  not erythematous LEFT TM full, not erythematous    Mouth/Throat:     Mouth: Mucous membranes are moist.     Pharynx: Oropharynx is clear. No oropharyngeal exudate or posterior oropharyngeal erythema.  Pulmonary:     Effort: Pulmonary effort is normal.     Breath sounds: Normal breath sounds. No wheezing, rhonchi or rales.  Lymphadenopathy:     Cervical: No cervical adenopathy.  Skin:    General: Skin is warm and dry.  Neurological:     Mental Status: She is alert and oriented to person, place, and time.  Mental status is at baseline.  Psychiatric:        Mood and Affect: Mood normal.        Behavior: Behavior normal.        Thought Content: Thought content normal.        Judgment: Judgment normal.     Results:  Studies Obtained And Personally Reviewed By Me: Labs:  CBC w/ Differential Lab Results  Component Value Date   WBC 5.3 10/23/2023   RBC 4.09 10/23/2023   HGB 13.8 10/23/2023   HCT 41 10/23/2023   PLT 280 10/23/2023   MCV 100.2 (H) 01/17/2020   MCH 33.4 01/17/2020   MCHC 33.3 01/17/2020   RDW 11.9 01/17/2020    Comprehensive Metabolic Panel Lab Results  Component Value Date   NA 137 10/23/2023   K 4.0 10/23/2023   CL 102 10/23/2023   CO2 20 10/23/2023   BUN 13 10/23/2023   CREATININE 0.7 10/23/2023   CALCIUM 9.5 10/23/2023   ALBUMIN 4.8 10/23/2023   AST 19 10/02/2022   ALT 10 10/02/2022   ALKPHOS 65 10/02/2022   EGFR 103 10/23/2023   Lipid Panel  Lab Results  Component Value Date   CHOL 206 (A) 10/02/2022   HDL 77 (A) 10/02/2022   LDLCALC 108 10/02/2022   TRIG 107 10/02/2022   A1c Lab Results  Component Value Date   HGBA1C 5.1 10/23/2023    TSH Lab Results  Component Value Date   TSH 1.05 10/23/2023   Assessment & Plan:   Meds ordered this encounter  Medications   butalbital -acetaminophen-caffeine  (FIORICET) 50-325-40 MG tablet    Sig: One or two tabs  by mouth every 6 hours as needed for headache. Take with food.    Dispense:  15 tablet    Refill:  0   methylPREDNISolone  (MEDROL ) 4 MG tablet    Sig: Take in tapering course as directed 6-5-4-3-2-1    Dispense:  21 tablet    Refill:  0   levofloxacin  (LEVAQUIN ) 500 MG tablet    Sig: Take 1 tablet (500 mg total) by mouth daily for 7 days.    Dispense:  7 tablet    Refill:  0   fluconazole  (DIFLUCAN ) 150 MG tablet    Sig: Take 1 tablet (150 mg total) by mouth once for 1 dose.    Dispense:  1 tablet    Refill:  1  Dx: Acute intractable headache        Anxiety state- concerned sometime  serious could be wrong- longstanding hx of anxiety        Acute Sinusitis She says that last week she was ill with symptoms of sneezing, clear draining rhinorrhea, and left-sided sinus pressure. This week, while those symptoms have resolved, she has now been experiencing an intermittent left-sided headache for which she was taking Advil Cold/Sinus. Today, this has worsened into a stabbing pain behind her left eye  that has not been alleviated after taking Naproxen . Denies any fever/chills, cough, or expectoration of discolored sputum, as well as recent travel. She does reportedly have history of migraines, but her current headache does not feel similar to those  She will test herself for Covid-19 at home to r/o as cause of symptoms. Sending in Fioricet 50-325-40 mg to take 1-2 tablets q6 hours as needed for headache, 500 mg Levaquin  - take 1 tablet daily with a meal for 7 days for maxillary sinusitis, 4 mg Medrol  tapering course 6-5-4-3-2-1, and 150 mg Diflucan  in case she develops Candida vaginitis.    I,Emily Lagle,acting as a Neurosurgeon for Ronal JINNY Hailstone, MD.,have documented all relevant documentation on the behalf of Ronal JINNY Hailstone, MD,as directed by  Ronal JINNY Hailstone, MD while in the presence of Ronal JINNY Hailstone, MD.  I, Ronal JINNY Hailstone, MD, have reviewed all documentation for this visit. The documentation on 04/27/2024 for the exam, diagnosis, procedures, and orders are all accurate and complete.

## 2024-04-28 NOTE — Patient Instructions (Signed)
 Please test for Covid when you get home. Declines Covid testing here. Take Fioricet 1-2 tabs every 4-6 hours as needed for headache. Take Levaquin  500 mg daily for acute sinusitis with a meal, has taken this before and tolerated well. May take Diflucan  if needed for Candida vaginitis. Rest and stay well hydrated. Call back if not improving in 48 hours or sooner if worse.

## 2024-05-12 DIAGNOSIS — M9905 Segmental and somatic dysfunction of pelvic region: Secondary | ICD-10-CM | POA: Diagnosis not present

## 2024-05-12 DIAGNOSIS — M25659 Stiffness of unspecified hip, not elsewhere classified: Secondary | ICD-10-CM | POA: Diagnosis not present

## 2024-05-12 DIAGNOSIS — M9902 Segmental and somatic dysfunction of thoracic region: Secondary | ICD-10-CM | POA: Diagnosis not present

## 2024-05-12 DIAGNOSIS — M9904 Segmental and somatic dysfunction of sacral region: Secondary | ICD-10-CM | POA: Diagnosis not present

## 2024-05-12 DIAGNOSIS — M7918 Myalgia, other site: Secondary | ICD-10-CM | POA: Diagnosis not present

## 2024-05-12 DIAGNOSIS — M5442 Lumbago with sciatica, left side: Secondary | ICD-10-CM | POA: Diagnosis not present

## 2024-05-12 DIAGNOSIS — M9903 Segmental and somatic dysfunction of lumbar region: Secondary | ICD-10-CM | POA: Diagnosis not present

## 2024-07-15 ENCOUNTER — Encounter: Payer: Self-pay | Admitting: Internal Medicine

## 2024-07-16 ENCOUNTER — Telehealth (INDEPENDENT_AMBULATORY_CARE_PROVIDER_SITE_OTHER): Admitting: Internal Medicine

## 2024-07-16 ENCOUNTER — Encounter: Payer: Self-pay | Admitting: Internal Medicine

## 2024-07-16 DIAGNOSIS — A084 Viral intestinal infection, unspecified: Secondary | ICD-10-CM

## 2024-07-16 DIAGNOSIS — F419 Anxiety disorder, unspecified: Secondary | ICD-10-CM

## 2024-07-16 DIAGNOSIS — Z8659 Personal history of other mental and behavioral disorders: Secondary | ICD-10-CM | POA: Diagnosis not present

## 2024-07-16 MED ORDER — PROMETHAZINE HCL 25 MG PO TABS: 25.0000 mg | ORAL_TABLET | Freq: Three times a day (TID) | ORAL | 0 refills | Status: AC | PRN

## 2024-07-16 NOTE — Patient Instructions (Signed)
 Patient seen virtually today with viral gastroenteritis.  Says this has affected her entire family.  Children has been out of school.  She is seen in bed and looks a bit pale.  Is able to keep clear liquids down but needs Phenergan  for nausea and this was prescribed today orally.  Patient advised to go to freestanding emergency department in the area if symptoms worsen or if she feels she needs IV hydration.  Patient also reminded that she is not booked a health maintenance exam here in some time and will need to do so in the near future.

## 2024-07-16 NOTE — Progress Notes (Signed)
 Patient Care Team: Brittney Brittney PARAS, MD as PCP - General (Internal Medicine)  I connected with Brittney Cannon on 07/16/24 at 9:40 am by video enabled telemedicine visit and verified that I am speaking with the correct person using two identifiers.   I discussed the limitations, risks, security and privacy concerns of performing an evaluation and management service by telemedicine and the availability of in-person appointments. I also discussed with the patient that there may be a patient responsible charge related to this service. The patient expressed understanding and agreed to proceed.   Other persons participating in the visit and their role in the encounter: Medical scribe, Brittney Cannon  Patient's location: Home  Provider's location: Clinic   I provided 10 minutes of face-to-face video visit time during this encounter, and > 50% was spent counseling as documented under my assessment & plan. Also spent time with chart review and decision making as well as e-scribing medication. Total time spent with this encounter is 20  minutes.  Chief Complaint: Nausea    Subjective:    Patient ID: Brittney Cannon , Female    DOB: [birth year], 48 y.o.    MRN: [id number]   48 y.o. Female presents today for Nausea. Patient has a past medical history of Hypothyroidism, Osteopenia, Anxiety.  She has been experiencing Nausea/vomiting and diarrhea since Wednesday night. She believes that she caught it from her children as they have also been sick with the same symptoms. She said today she is just experiencing nausea. She denies having blood in her stool or chills. She did say that she had a Fever of 103 degrees that she was able to treat with Advil. Yesterday she was unable to eat anything without vomiting but today she has been able to drink poweraid and eat ice chips. She said that she took a Covid-19 and Influenza test and they were both negative.    Past Medical History:  Diagnosis Date   COVID-19  08/31/2019   cold symptoms, sob, loss of taste and smell, all symptoms resolved with quaranatine   Family history of adverse reaction to anesthesia    mother slow to awaken, takes days   Hypothyroidism    IBS (irritable bowel syndrome)    Osteopenia      Family History  Problem Relation Age of Onset   Osteoporosis Mother    Osteoporosis Sister    Osteoporosis Maternal Grandmother          Review of Systems  Constitutional:  Positive for fever. Negative for chills.  Gastrointestinal:  Positive for diarrhea, nausea and vomiting. Negative for blood in stool.        Objective:   Vitals: There were no vitals taken for this visit.   Physical Exam Vitals and nursing note reviewed.       Results:    Labs:       Component Value Date/Time   NA 137 10/23/2023 0000   K 4.0 10/23/2023 0000   CL 102 10/23/2023 0000   CO2 20 10/23/2023 0000   BUN 13 10/23/2023 0000   CREATININE 0.7 10/23/2023 0000   CALCIUM 9.5 10/23/2023 0000   ALBUMIN 4.8 10/23/2023 0000   AST 19 10/02/2022 0000   ALT 10 10/02/2022 0000   ALKPHOS 65 10/02/2022 0000     Lab Results  Component Value Date   WBC 5.3 10/23/2023   HGB 13.8 10/23/2023   HCT 41 10/23/2023   MCV 100.2 (H) 01/17/2020   PLT 280  10/23/2023    Lab Results  Component Value Date   CHOL 206 (A) 10/02/2022   HDL 77 (A) 10/02/2022   LDLCALC 108 10/02/2022   TRIG 107 10/02/2022    Lab Results  Component Value Date   HGBA1C 5.1 10/23/2023     Lab Results  Component Value Date   TSH 1.05 10/23/2023        Assessment & Plan:   Meds ordered this encounter  Medications   promethazine  (PHENERGAN ) 25 MG tablet    Sig: Take 1 tablet (25 mg total) by mouth every 8 (eight) hours as needed for nausea or vomiting.    Dispense:  30 tablet    Refill:  0    Viral gastroenteritis: She has been experiencing Nausea/vomiting and diarrhea since Wednesday night. She believes that she caught it from her children as they  have also been sick with the same symptoms. She said today she is just experiencing nausea. She denies having blood in her stool or chills. She did say that she had a Fever of 103 degrees that she was able to treat with Advil. Yesterday she was unable to eat anything without vomiting but today she has been able to drink poweraid and eat ice chips. She said that she took a Covid-19 and Influenza test and they were both negative.   Phenergan  25 mg every 8 hours as needed prescribed.   Hx of anxiety treated with Xanax  as needed. Reassured she does not sound dehydrated. Will continue to try to hydrate orally and take Phenergan  tablets for nausea as needed. If symptoms worsen, she  can go to free standing Emergency Depts in the area for evaluation.  I,Brittney Cannon,acting as a scribe for Brittney JINNY Hailstone, MD.,have documented all relevant documentation on the behalf of Brittney JINNY Hailstone, MD,as directed by  Brittney JINNY Hailstone, MD while in the presence of Brittney JINNY Hailstone, MD.  I, Brittney JINNY Hailstone, MD, have reviewed all documentation for this visit. The documentation on 07/16/2024 for the exam, diagnosis, procedures, and orders are all accurate and complete.

## 2024-08-24 ENCOUNTER — Telehealth (INDEPENDENT_AMBULATORY_CARE_PROVIDER_SITE_OTHER): Admitting: Internal Medicine

## 2024-08-24 ENCOUNTER — Encounter: Payer: Self-pay | Admitting: Internal Medicine

## 2024-08-24 DIAGNOSIS — J069 Acute upper respiratory infection, unspecified: Secondary | ICD-10-CM

## 2024-08-24 DIAGNOSIS — H9201 Otalgia, right ear: Secondary | ICD-10-CM | POA: Diagnosis not present

## 2024-08-24 DIAGNOSIS — R509 Fever, unspecified: Secondary | ICD-10-CM | POA: Diagnosis not present

## 2024-08-24 MED ORDER — LEVOFLOXACIN 500 MG PO TABS: 500.0000 mg | ORAL_TABLET | Freq: Every day | ORAL | 0 refills | Status: AC

## 2024-08-24 MED ORDER — TERCONAZOLE 0.4 % VA CREA: 1.0000 | TOPICAL_CREAM | Freq: Every day | VAGINAL | 0 refills | Status: AC

## 2024-08-24 NOTE — Progress Notes (Signed)
 "   Patient Care Team: Perri Ronal PARAS, MD as PCP - General (Internal Medicine)  I connected with Samhita E Gethers on 08/24/2024 at 3:36 PM by video enabled telemedicine visit and verified that I am speaking with the correct person using two identifiers.   I discussed the limitations, risks, security and privacy concerns of performing an evaluation and management service by telemedicine and the availability of in-person appointments. I also discussed with the patient that there may be a patient responsible charge related to this service. The patient expressed understanding and agreed to proceed.   Other persons participating in the visit and their role in the encounter: Medical scribe, Nestora JAYSON Bathe  Patients location: Home in [place] Providers location: Clinic in [place]  I provided 10 minutes of face-to-face video visit time during this encounter, and > 50% was spent counseling as documented under my assessment & plan. Also conducted chart review for an additional 10 minutes.  Chief Complaint: Right ear pain  Subjective:    Patient ID: Brittney Cannon , Female    DOB: 11/03/1975, 48 y.o.    MRN: [id number]   48 y.o. Female presents today for right ear pain and  Sinus congestion. Patient has a past medical history of  Hypothyroidism, Osteopenia, Anxiety.  She says that she became ill with respiratory infection symptoms/nasal congestion last week. She says that has fullness/congestion in her right ear that is painful. She also has a low grade fever. Denies cough, sore throat, headache, nausea, vomiting, chills and diarrhea. She has been treating her symptoms with Tylenol and Mucinex.    Past Medical History:  Diagnosis Date   COVID-19 08/31/2019   cold symptoms, sob, loss of taste and smell, all symptoms resolved with quaranatine   Family history of adverse reaction to anesthesia    mother slow to awaken, takes days   Hypothyroidism    IBS (irritable bowel syndrome)    Osteopenia       Family History  Problem Relation Age of Onset   Osteoporosis Mother    Osteoporosis Sister    Osteoporosis Maternal Grandmother    Family history: Father with history of hypertension. Mother with history of osteoporosis, hip fracture, arthritis and kidney stones. 1 sister in good health. 2 brothers, 1 of whom is overweight.   Social history: She is a widow but engaged to be married later this year.  She has 2 sons.  Non-smoker.  Does not consume alcohol.     Review of Systems  Constitutional:  Negative for chills and fever.  HENT:  Positive for congestion and ear pain. Negative for sore throat.   Respiratory:  Negative for cough.   Gastrointestinal:  Negative for diarrhea, nausea and vomiting.        Objective:   Vitals: There were no vitals taken for this visit.  Patient is seen virtually in her home.  She looks fatigued.  She is complaining of right ear pain and some nasal congestion.  She is able to give a clear history.  Not heard to be coughing.     Results:     Labs:       Component Value Date/Time   NA 137 10/23/2023 0000   K 4.0 10/23/2023 0000   CL 102 10/23/2023 0000   CO2 20 10/23/2023 0000   BUN 13 10/23/2023 0000   CREATININE 0.7 10/23/2023 0000   CALCIUM 9.5 10/23/2023 0000   ALBUMIN 4.8 10/23/2023 0000   AST 19 10/02/2022 0000  ALT 10 10/02/2022 0000   ALKPHOS 65 10/02/2022 0000     Lab Results  Component Value Date   WBC 5.3 10/23/2023   HGB 13.8 10/23/2023   HCT 41 10/23/2023   MCV 100.2 (H) 01/17/2020   PLT 280 10/23/2023    Lab Results  Component Value Date   CHOL 206 (A) 10/02/2022   HDL 77 (A) 10/02/2022   LDLCALC 108 10/02/2022   TRIG 107 10/02/2022    Lab Results  Component Value Date   HGBA1C 5.1 10/23/2023     Lab Results  Component Value Date   TSH 1.05 10/23/2023        Assessment & Plan:   Meds ordered this encounter  Medications   levofloxacin  (LEVAQUIN ) 500 MG tablet    Sig: Take 1 tablet (500 mg  total) by mouth daily for 7 days.    Dispense:  7 tablet    Refill:  0   terconazole  (TERAZOL 7 ) 0.4 % vaginal cream    Sig: Place 1 applicator vaginally at bedtime.    Dispense:  45 g    Refill:  0    Acute right otitis media: She says that she became sick around last week. She says that has congestion in her right ear that is painful. She also has a low grade fever. Denies cough, sore throat, headache, nausea, vomiting chills and diarrhea. She has been treating her symptoms with Tylenol and Musinex.    Levaquin  500 mg daily  for 7 days prescribed.   Terconazole  0.4% vaginal cream place one applicator vaginally at bedtime x 7 days  if needed for Candida vaginitis prescribed.   Patient is overdue for CPE in this office. Has been reminded of this at last virtual visit on November 21st. Has had recent GYN exam.   I,Makayla C Reid,acting as a scribe for Ronal JINNY Hailstone, MD.,have documented all relevant documentation on the behalf of Ronal JINNY Hailstone, MD,as directed by  Ronal JINNY Hailstone, MD while in the presence of Ronal JINNY Hailstone, MD.  I, Ronal JINNY Hailstone, MD, have reviewed all documentation for this visit. The documentation on 08/24/2024 for the exam, diagnosis, procedures, and orders are all accurate and complete.   "

## 2024-08-24 NOTE — Patient Instructions (Addendum)
 We are sorry you are not feeling well.  We have sent in to pharmacy in [place] Levaquin  500 milligrams daily for 7 days.  Also at your request we have sent in Terazol 7  vaginal cream to use 1 applicatorful in vagina at bedtime for 7 days if needed for Candida vaginitis while on antibiotics.  Rest and stay well-hydrated.  Once again we are reminding you that you are past due for health maintenance exam in this office and asked that you make an appointment within the next 30 days.
